# Patient Record
Sex: Female | Born: 1975
Health system: Southern US, Community
[De-identification: ages and names within clinical notes are randomized; demographics above are authoritative.]

## PROBLEM LIST (undated history)

## (undated) DIAGNOSIS — E669 Obesity, unspecified: Secondary | ICD-10-CM

## (undated) DIAGNOSIS — F329 Major depressive disorder, single episode, unspecified: Secondary | ICD-10-CM

## (undated) DIAGNOSIS — K219 Gastro-esophageal reflux disease without esophagitis: Secondary | ICD-10-CM

## (undated) DIAGNOSIS — T7840XA Allergy, unspecified, initial encounter: Secondary | ICD-10-CM

## (undated) DIAGNOSIS — I493 Ventricular premature depolarization: Secondary | ICD-10-CM

## (undated) DIAGNOSIS — G4730 Sleep apnea, unspecified: Secondary | ICD-10-CM

## (undated) DIAGNOSIS — F1910 Other psychoactive substance abuse, uncomplicated: Secondary | ICD-10-CM

## (undated) DIAGNOSIS — F17200 Nicotine dependence, unspecified, uncomplicated: Secondary | ICD-10-CM

## (undated) DIAGNOSIS — I10 Essential (primary) hypertension: Secondary | ICD-10-CM

## (undated) DIAGNOSIS — F32A Depression, unspecified: Secondary | ICD-10-CM

## (undated) DIAGNOSIS — F419 Anxiety disorder, unspecified: Secondary | ICD-10-CM

## (undated) HISTORY — DX: Essential (primary) hypertension: I10

## (undated) HISTORY — DX: Depression, unspecified: F32.A

## (undated) HISTORY — DX: Other psychoactive substance abuse, uncomplicated: F19.10

## (undated) HISTORY — DX: Sleep apnea, unspecified: G47.30

## (undated) HISTORY — DX: Nicotine dependence, unspecified, uncomplicated: F17.200

## (undated) HISTORY — PX: TUBAL LIGATION: SHX77

## (undated) HISTORY — DX: Major depressive disorder, single episode, unspecified: F32.9

## (undated) HISTORY — PX: WISDOM TOOTH EXTRACTION: SHX21

## (undated) HISTORY — DX: Ventricular premature depolarization: I49.3

## (undated) HISTORY — DX: Gastro-esophageal reflux disease without esophagitis: K21.9

## (undated) HISTORY — DX: Obesity, unspecified: E66.9

## (undated) HISTORY — DX: Allergy, unspecified, initial encounter: T78.40XA

## (undated) HISTORY — DX: Anxiety disorder, unspecified: F41.9

---

## 1995-04-29 HISTORY — PX: OTHER SURGICAL HISTORY: SHX169

## 1999-02-27 ENCOUNTER — Emergency Department (HOSPITAL_COMMUNITY): Admission: EM | Admit: 1999-02-27 | Discharge: 1999-02-27 | Payer: Self-pay | Admitting: Internal Medicine

## 2009-07-19 ENCOUNTER — Other Ambulatory Visit: Admission: RE | Admit: 2009-07-19 | Discharge: 2009-07-19 | Payer: Self-pay | Admitting: Obstetrics and Gynecology

## 2010-01-16 ENCOUNTER — Ambulatory Visit: Payer: Self-pay | Admitting: Cardiology

## 2010-01-18 ENCOUNTER — Ambulatory Visit: Payer: Self-pay | Admitting: Cardiology

## 2010-01-18 ENCOUNTER — Ambulatory Visit (HOSPITAL_COMMUNITY): Admission: RE | Admit: 2010-01-18 | Discharge: 2010-01-18 | Payer: Self-pay | Admitting: Cardiology

## 2010-02-01 ENCOUNTER — Ambulatory Visit: Payer: Self-pay | Admitting: Cardiology

## 2010-02-27 ENCOUNTER — Ambulatory Visit: Payer: Self-pay | Admitting: Cardiology

## 2010-05-23 DIAGNOSIS — E663 Overweight: Secondary | ICD-10-CM | POA: Insufficient documentation

## 2010-05-23 DIAGNOSIS — R002 Palpitations: Secondary | ICD-10-CM | POA: Insufficient documentation

## 2010-05-24 ENCOUNTER — Ambulatory Visit
Admission: RE | Admit: 2010-05-24 | Discharge: 2010-05-24 | Payer: Self-pay | Source: Home / Self Care | Attending: Internal Medicine | Admitting: Internal Medicine

## 2010-05-24 ENCOUNTER — Encounter: Payer: Self-pay | Admitting: Internal Medicine

## 2010-05-26 DIAGNOSIS — I4949 Other premature depolarization: Secondary | ICD-10-CM | POA: Insufficient documentation

## 2010-05-31 ENCOUNTER — Telehealth: Payer: Self-pay | Admitting: Internal Medicine

## 2010-05-31 ENCOUNTER — Other Ambulatory Visit: Payer: Self-pay | Admitting: Internal Medicine

## 2010-05-31 DIAGNOSIS — Q208 Other congenital malformations of cardiac chambers and connections: Secondary | ICD-10-CM

## 2010-06-05 NOTE — Assessment & Plan Note (Signed)
Summary: nep/tachycardia/bradycardia/mt   Visit Type:  Initial Consult Referring Provider:  Dr Deborah Chalk Primary Provider:  none  CC:  palpitations.  History of Present Illness: Ms Valerie Shannon is a pleasant 35 yo WF with a h/o obesity, tobacco use, and PVCs who presents today for EP consultation.  She states that she initially began to have palpitations 1 year ago.  She describes these palpitations as a "skipped heart beat" followed by a forceful heart beat.  These are more prominent when supine or when anxious.  She was placed on a monitor for a dental procedure 10/11 and was found to have very frequent PVCs.  She was referred to Dr Deborah Chalk who placed a 24 hour monitor which documented 22 thousand PVCs without NSVT.  She had an echocardiogram which revealed normal EF without LV or RV structural concerns.  Presently she finds that stress and elevated BP will cause PVCs.  She reports having "lots of stress" which she attributes to job, divorce, and being a single mom. She denies CP, SOB, dizziness, or syncope.  She reports hvaing occasional episodes of ligthheadedness particularly after a long surgical procedure at work.  She reports diaphoresis and lightheadedness.  She has been found to have blood sugars 60s.  She states that these episodes always resolve with eating something to elevate her blood sugar.  She denies any other episodes of presyncope or lightheadedness.  Current Medications (verified): 1)  None  Allergies (verified): No Known Drug Allergies  Past History:  Past Medical History: Obesity ongoing tobacco use PVCs  denies rheumatic fever or childhood medical issues  Past Surgical History: BTL 67  Family History: father had a sudden cardiac arrest at age 66 and was rescucitated 3-4 years ago.  He has an ICD and is followed by Dr Ladona Ridgel.  Paternal grandfather died suddenly at age 78 at home.  Social History: Works full time for [employer].  She is divorced and has 2  children. Tobacco Use - 1PPD (previously 2 PPD) Previously cocaine use but quit 2005 Alcohol Use - yes -- rare Regular Exercise - no  Review of Systems       All systems are reviewed and negative except as listed in the HPI.   Vital Signs:  Patient profile:   35 year old female Height:      65 inches Weight:      254 pounds BMI:     42.42 Pulse rate:   80 / minute BP sitting:   126 / 76  (left arm) Cuff size:   regular  Vitals Entered By: Hardin Negus, RMA (May 24, 2010 2:46 PM)  Physical Exam  General:  overweight Head:  normocephalic and atraumatic Eyes:  PERRLA/EOM intact; conjunctiva and lids normal. Mouth:  Teeth, gums and palate normal. Oral mucosa normal. Neck:  Neck supple, no JVD. No masses, thyromegaly or abnormal cervical nodes. Lungs:  Clear bilaterally to auscultation and percussion. Heart:  RRR, no m/r/g, no ectopy upon listening for several minutes Abdomen:  Bowel sounds positive; abdomen soft and non-tender without masses, organomegaly, or hernias noted. No hepatosplenomegaly. Msk:  Back normal, normal gait. Muscle strength and tone normal. Extremities:  No clubbing or cyanosis. Neurologic:  Alert and oriented x 3. Skin:  Intact without lesions or rashes. Psych:  Normal affect.   EKG  Procedure date:  05/24/2010  Findings:      sinus rhythm 80 bpm, PR 144, QRS 88, QTc 452, otherwise normale ekg  Holter Monitor  Procedure date:  02/27/2010  Findings:      predominant rhythm is sinus with PVCs.  During a 24 hour period, she had 22k PVCs (25% of beats) observed.  No episodes of VT.  NO other arrhythias.  HR rate 67-109 bpm (mean 92).  Comments:      Obtained by Dr Deborah Chalk [facility] office)  Echocardiogram  Procedure date:  01/18/2010  Findings:      LVEF 55-60%, LVEDD 50, IVS 13, PW 13, LA 47 normal LV wall motion, RV size and function were normal, no significant valvular abnormality  Comments:      Obtained by Dr  Deborah Chalk [facility])  Impression & Recommendations:  Problem # 1:  PREMATURE VENTRICULAR CONTRACTIONS, FREQUENT (ICD-427.69) Assessment Deteriorated The patient has a very high PVC burden as recently documented by her holter monitor 11/11.  Interestingly, she has no PVCs in the office today.  She is only moderately symptomatic with her PVCs.  Her primary concern is with the sudden death of her father and paternal grandfather.  Certainly, this family history is worrisome.  The patient's echo and EKG however apper to be low risk. Given her family history and frequent ventricular ectopy, I feel that further risk stratefication is necessary at this time. I have strongly recommended that she proceed with signal average EKG and also a cardiac MRI to rule out ARVD.  In addition, I would recommend a GXT to evaluate for catecholaminergic arrhythmias.  I have also advised that she take nadolol 10mg  daily. I will see her again in 4 weeks.   At this point, she is very reluctant to schedule further testing, stating that her work schedule will not allow.  I have informed her that I think that further testing is very important and have encouraged her to have this done soon. We will also attempt to obtain records from her father to further evaluate the cause of his sudden cardiac arrest.  Problem # 2:  OVERWEIGHT/OBESITY (ICD-278.02) weight loss is advised, however she shoudl avoid any weight loss supplements/ medical agents given her ventricular arrhythmias  Other Orders: EKG w/ Interpretation (93000) Treadmill (Treadmill) Cardiac MRI (Cardiac MRI)  Patient Instructions: 1)  Your physician has requested that you have an exercise tolerance test.  For further information please visit [email or web address]  Please also follow instruction sheet, as given. in 4-6 weeks with Dr Johney Frame 2)  Your physician has recommended you make the following change in your medication: start Nadolol 10mg   daily 3)  Your physician has requested that you have a cardiac MRI.  Cardiac MRI uses a computer to create images of your heart as it's beating, producing both still and moving pictures of your heart and major blood vessels. For further information please visit  [email or web address]  Please follow the instruction sheet given to you today for more information. 4)  Signal Avg EKG 5)  Pt will call back when she wants to schedule Prescriptions: NADOLOL 20 MG TABS (NADOLOL) 1/2 tablet daily  #30 x 3   Entered by:   Dennis Bast, RN, BSN   Authorized by:   Hillis Range, MD   Signed by:   Dennis Bast, RN, BSN on 05/24/2010   Method used:   Electron[facility] to        [facility]  [address] [phone][facility] (retail)       [address]       [place], [place]  [id number]  Ph: [phone] or [phone]       Fax: [phone]   RxID:   [id number]

## 2010-06-05 NOTE — Progress Notes (Signed)
Summary: cardiac mri on hold at this time per pt request  Phone Note Outgoing Call   Caller: gesila davis  Call placed by: Lorne Skeens,  May 31, 2010 2:53 PM Call placed to: Patient Request: Send information Summary of Call: Call patient to inform her of an  appt for cardiac mri. pt inform me that she wanted to put the cardiac mri on hold at this time,she will call back when she decided if she gonna have it or not.  Initial call taken by: Lorne Skeens,  May 31, 2010 2:56 PM

## 2010-06-12 ENCOUNTER — Other Ambulatory Visit (HOSPITAL_COMMUNITY): Payer: Self-pay

## 2010-07-11 ENCOUNTER — Encounter: Payer: Self-pay | Admitting: Internal Medicine

## 2010-10-24 ENCOUNTER — Emergency Department (HOSPITAL_COMMUNITY): Payer: 59

## 2010-10-24 ENCOUNTER — Emergency Department (HOSPITAL_COMMUNITY)
Admission: EM | Admit: 2010-10-24 | Discharge: 2010-10-24 | Disposition: A | Discharge status: Home / Self Care | Payer: 59 | Attending: Emergency Medicine | Admitting: Emergency Medicine

## 2010-10-24 ENCOUNTER — Telehealth: Payer: Self-pay | Admitting: Internal Medicine

## 2010-10-24 DIAGNOSIS — R002 Palpitations: Secondary | ICD-10-CM | POA: Insufficient documentation

## 2010-10-24 DIAGNOSIS — R52 Pain, unspecified: Secondary | ICD-10-CM | POA: Insufficient documentation

## 2010-10-24 DIAGNOSIS — R0609 Other forms of dyspnea: Secondary | ICD-10-CM | POA: Insufficient documentation

## 2010-10-24 DIAGNOSIS — R0989 Other specified symptoms and signs involving the circulatory and respiratory systems: Secondary | ICD-10-CM | POA: Insufficient documentation

## 2010-10-24 DIAGNOSIS — D649 Anemia, unspecified: Secondary | ICD-10-CM | POA: Insufficient documentation

## 2010-10-24 DIAGNOSIS — R072 Precordial pain: Secondary | ICD-10-CM | POA: Insufficient documentation

## 2010-10-24 DIAGNOSIS — R0602 Shortness of breath: Secondary | ICD-10-CM | POA: Insufficient documentation

## 2010-10-24 DIAGNOSIS — Z79899 Other long term (current) drug therapy: Secondary | ICD-10-CM | POA: Insufficient documentation

## 2010-10-24 LAB — BASIC METABOLIC PANEL
CO2: 23 mEq/L (ref 19–32)
Calcium: 7.6 mg/dL — ABNORMAL LOW (ref 8.4–10.5)
Chloride: 104 mEq/L (ref 96–112)
Potassium: 4 mEq/L (ref 3.5–5.1)
Sodium: 138 mEq/L (ref 135–145)

## 2010-10-24 LAB — CBC
MCH: 21.7 pg — ABNORMAL LOW (ref 26.0–34.0)
MCHC: 29.1 g/dL — ABNORMAL LOW (ref 30.0–36.0)
Platelets: 216 10*3/uL (ref 150–400)
RDW: 19.5 % — ABNORMAL HIGH (ref 11.5–15.5)

## 2010-10-24 LAB — CK TOTAL AND CKMB (NOT AT ARMC): Total CK: 69 U/L (ref 7–177)

## 2010-10-24 NOTE — Telephone Encounter (Signed)
Pt. States she  was seen by Dr. Johney Frame on January 27/12. Pt. Was prescribed  Nadolol 20 mg tablets 1/2 tablet daily, and stop the Metoprolol 50 mg.  Patient said never got the medication, and she continue taken Metoprolol 50 mg daily. Pt stop taken the medication for 2 nights Monday and Tuesday. Then she started having chest pain and PVC's. She was taken to [facility] ER today. There labs and other tests were normal and send home. She was advised to call the office today Pt. Would like to know if she needs to start taken the Nadolol 10 mg daily or need to have the dose increased. Does  she needs to be seen in the office soon. Nadolol 10 mg [facility] to start taken. Pt. Aware to start medication. I will let the MD and nurse know about this issue.

## 2010-10-24 NOTE — Telephone Encounter (Signed)
Pt went to emergency room - by EMS. Pt at home now. Dr. Deborah Chalk put pt on beta blocker. Dr. Johney Frame changed the med's. Pt never gotten the medication filled.  Pt wants to discuss medication called in or come into the office for an appoitment.  S/p pvc.

## 2010-10-31 NOTE — Telephone Encounter (Signed)
Please schedule follow-up with me as a next available (not urgent).

## 2010-11-04 NOTE — Telephone Encounter (Signed)
APPT SCHEDULED FOR 12-11-10  AT 3:30 PM WITH DR Johney Frame . PT AWARE /CY

## 2010-12-11 ENCOUNTER — Encounter: Payer: Self-pay | Admitting: Internal Medicine

## 2010-12-11 ENCOUNTER — Ambulatory Visit (INDEPENDENT_AMBULATORY_CARE_PROVIDER_SITE_OTHER): Payer: 59 | Admitting: Internal Medicine

## 2010-12-11 DIAGNOSIS — F17200 Nicotine dependence, unspecified, uncomplicated: Secondary | ICD-10-CM

## 2010-12-11 DIAGNOSIS — E663 Overweight: Secondary | ICD-10-CM

## 2010-12-11 DIAGNOSIS — R079 Chest pain, unspecified: Secondary | ICD-10-CM | POA: Insufficient documentation

## 2010-12-11 DIAGNOSIS — Z720 Tobacco use: Secondary | ICD-10-CM

## 2010-12-11 DIAGNOSIS — I4949 Other premature depolarization: Secondary | ICD-10-CM

## 2010-12-11 MED ORDER — VARENICLINE TARTRATE 0.5 MG X 11 & 1 MG X 42 PO MISC: ORAL | Status: AC

## 2010-12-11 NOTE — Patient Instructions (Addendum)
Your physician wants you to follow-up in: 6 months with Dr Jacquiline Doe will receive a reminder letter in the mail two months in advance. If you don't receive a letter, please call our office to schedule the follow-up appointment.   Start Chantix starter pack

## 2010-12-11 NOTE — Assessment & Plan Note (Signed)
Given the temporal correlation to Ibuprofen, her pain may be GI in origin.  Given her very concerning family history of sudden death/ arrhythmias, I think that we should also exclude ischemia as a cause.  I have strongly advised GXT at this time.  Unfortunately, she is clear in her decision to decline.  She will contact me immediately should any further chest discomfort occur.  If she develops sustained chest discomfort, she should return to [facility] for more urgent evaluation.

## 2010-12-11 NOTE — Assessment & Plan Note (Signed)
Presently symptoms are very well controlled with nadolol.  She is tolerating this medicine without difficulty.  I remain very concerned about her family history of sudden death.  I have again recommended that we proceed with cardiac MRI and SAEKG to evaluate for ARVD as well as a GXT to exclude both ischemia and catecholaminergic VT.  Unfortunately, she is very clear in her decision to decline further cardiac testing at this time.  She will continue nadolol.  She is willing to contact me immediately should she have syncope, presyncope, or worsening palpitations.

## 2010-12-11 NOTE — Progress Notes (Signed)
The patient presents today for routine electrophysiology followup.  Since last being seen in our clinic, the patient reports doing reasonably well.   She feels that her PVCs are now well controlled with nadolol but has only taken nadolol for several weeks.  She did not comply with cardiac MRI, SAEKG or GXT as I previously recommended. She reports doing very well until 10/24/10.  She states that she was at work when she developed acute chest "pressure" lasting 15 minutes.  She reports gradually increases in heart rate during the episode and also noticed PVCs.  She reports becoming "very anxious".  She reports feeling PVCs after becoming anxious.  She was seen at [facility] and found to have only sinus rhythm.  I have reviewed the EKG in [facility] which reveals no ischemic changes.  She was discharge.  In retrospect, she states that she had taken ibuprofen 800mg  earlier that morning. She did well until 1 week ago, when she developed similar "chest pressure" lasting about 10 minutes.  She denies associated palpitations, SOB, dizziness, presyncope or syncope.  This episode spontaneously resolved and she did not seek medical attention.  She reports taking Ibuprofen that morning also and feels that her symptoms were likely due to taking this medicine.  She has done very well since that time.    Today, she denies symptoms of palpitations, chest pain, shortness of breath, orthopnea, PND, lower extremity edema, dizziness, presyncope, syncope, or neurologic sequela.  The patient feels that she is tolerating medications without difficulties and is otherwise without complaint today.   Past Medical History  Diagnosis Date  . Premature ventricular contractions   . Obesity   . Tobacco dependence    Past Surgical History  Procedure Date  . Bilateral tubal ligation 1997    Current Outpatient Prescriptions  Medication Sig Dispense Refill  . acetaminophen (TYLENOL) 325 MG tablet Take 650 mg by mouth every 6  (six) hours as needed.        . Cyanocobalamin (VITAMIN B-12 CR PO) Take 1 tablet by mouth daily.        . Multiple Vitamin (MULTIVITAMIN) capsule Take 1 capsule by mouth daily.        . nadolol (CORGARD) 20 MG tablet 1/2 TAB PO QD       . Omega-3 Fatty Acids (FISH OIL BURP-LESS PO) Take 1 tablet by mouth daily.        . varenicline (CHANTIX STARTING MONTH PAK) 0.5 MG X 11 & 1 MG X 42 tablet Take one 0.5mg  tablet by mouth once daily for 3 days, then increase to one 0.5mg  tablet twice daily for 3 days, then increase to one 1mg  tablet twice daily.  53 tablet  0    No Known Allergies  History   Social History  . Marital Status: Divorced    Spouse Name: N/A    Number of Children: N/A  . Years of Education: N/A   Occupational History  . Not on file.   Social History Main Topics  . Smoking status: Current Some Day Smoker  . Smokeless tobacco: Not on file   Comment: chantix prescribed today, she is willing to quit  . Alcohol Use: Yes     rare  . Drug Use: No  . Sexually Active: Not on file   Other Topics Concern  . Not on file   Social History Narrative   Works full time for [employer].  She is divorced and has 2 children.  Family History  Problem Relation Age of Onset  . Arrhythmia      father had a sudden cardiac arrest at age 59 and was rescucitated 3-4 years ago.  He has an ICD and is followed by Dr Ladona Ridgel.  Paternal grandfather died suddenly at age 47 at home.    ROS-  All systems are reviewed and are negative except as outlined in the HPI above    Physical Exam: Filed Vitals:   12/11/10 1541  BP: 138/79  Pulse: 92  Resp: 12  Height: 5\' 5"  (1.651 m)  Weight: 262 lb (118.842 kg)    GEN- The patient is well appearing, alert and oriented x 3 today.   Head- normocephalic, atraumatic Eyes-  Sclera clear, conjunctiva pink Ears- hearing intact Oropharynx- clear Neck- supple, no JVP Lymph- no cervical lymphadenopathy Lungs- Clear to ausculation  bilaterally, normal work of breathing Heart- Regular rate and rhythm, no murmurs, rubs or gallops, PMI not laterally displaced GI- soft, NT, ND, + BS Extremities- no clubbing, cyanosis, or edema MS- no significant deformity or atrophy Skin- no rash or lesion Psych- euthymic mood, full affect Neuro- strength and sensation are intact  10/24/10 ekg from [facility] reveals sinus rhythm, no ischemic changes, Qtc 446  Assessment and Plan:

## 2010-12-11 NOTE — Assessment & Plan Note (Signed)
Cessation is strongly advised She is ready to quit. She states that she has well tolerated chantix in the past.  She has no suicidal ideations.  I will therefore initiate a chantix starter pack today.

## 2010-12-11 NOTE — Assessment & Plan Note (Signed)
Weight loss encouraged 

## 2012-03-16 ENCOUNTER — Encounter: Payer: Self-pay | Admitting: Cardiology

## 2012-03-28 ENCOUNTER — Ambulatory Visit (INDEPENDENT_AMBULATORY_CARE_PROVIDER_SITE_OTHER): Payer: 59 | Admitting: Family Medicine

## 2012-03-28 VITALS — BP 137/82 | HR 100 | Temp 98.2°F | Resp 18 | Ht 65.0 in | Wt 256.6 lb

## 2012-03-28 DIAGNOSIS — F411 Generalized anxiety disorder: Secondary | ICD-10-CM

## 2012-03-28 DIAGNOSIS — R5381 Other malaise: Secondary | ICD-10-CM

## 2012-03-28 DIAGNOSIS — F32A Depression, unspecified: Secondary | ICD-10-CM

## 2012-03-28 DIAGNOSIS — R5383 Other fatigue: Secondary | ICD-10-CM

## 2012-03-28 DIAGNOSIS — F329 Major depressive disorder, single episode, unspecified: Secondary | ICD-10-CM

## 2012-03-28 LAB — POCT CBC
Granulocyte percent: 79.1 %G (ref 37–80)
HCT, POC: 43.3 % (ref 37.7–47.9)
Hemoglobin: 13.1 g/dL (ref 12.2–16.2)
Lymph, poc: 1.9 (ref 0.6–3.4)
MCH, POC: 25.5 pg — AB (ref 27–31.2)
MCHC: 30.3 g/dL — AB (ref 31.8–35.4)
MCV: 84.4 fL (ref 80–97)
MID (cbc): 0.7 (ref 0–0.9)
MPV: 8.9 fL (ref 0–99.8)
POC Granulocyte: 10 — AB (ref 2–6.9)
POC LYMPH PERCENT: 15.2 %L (ref 10–50)
POC MID %: 5.7 %M (ref 0–12)
Platelet Count, POC: 237 10*3/uL (ref 142–424)
RBC: 5.13 M/uL (ref 4.04–5.48)
RDW, POC: 16 %
WBC: 12.7 10*3/uL — AB (ref 4.6–10.2)

## 2012-03-28 LAB — COMPREHENSIVE METABOLIC PANEL
ALT: 19 U/L (ref 0–35)
AST: 17 U/L (ref 0–37)
Albumin: 4.2 g/dL (ref 3.5–5.2)
Alkaline Phosphatase: 83 U/L (ref 39–117)
BUN: 11 mg/dL (ref 6–23)
CO2: 30 mEq/L (ref 19–32)
Calcium: 9.2 mg/dL (ref 8.4–10.5)
Chloride: 101 mEq/L (ref 96–112)
Creat: 0.64 mg/dL (ref 0.50–1.10)
Glucose, Bld: 88 mg/dL (ref 70–99)
Potassium: 4.4 mEq/L (ref 3.5–5.3)
Sodium: 139 mEq/L (ref 135–145)
Total Bilirubin: 0.4 mg/dL (ref 0.3–1.2)
Total Protein: 7 g/dL (ref 6.0–8.3)

## 2012-03-28 LAB — LIPID PANEL
Cholesterol: 158 mg/dL (ref 0–200)
HDL: 37 mg/dL — ABNORMAL LOW (ref >39)
LDL Cholesterol: 60 mg/dL (ref 0–99)
Total CHOL/HDL Ratio: 4.3 Ratio
Triglycerides: 306 mg/dL — ABNORMAL HIGH (ref <150)
VLDL: 61 mg/dL — ABNORMAL HIGH (ref 0–40)

## 2012-03-28 LAB — POCT GLYCOSYLATED HEMOGLOBIN (HGB A1C): Hemoglobin A1C: 5.2

## 2012-03-28 LAB — TSH: TSH: 1.123 u[IU]/mL (ref 0.350–4.500)

## 2012-03-28 LAB — GLUCOSE, POCT (MANUAL RESULT ENTRY): POC Glucose: 79 mg/dl (ref 70–99)

## 2012-03-28 MED ORDER — DULOXETINE HCL 20 MG PO CPEP: 20.0000 mg | ORAL_CAPSULE | Freq: Every day | ORAL | Status: DC

## 2012-03-28 NOTE — Patient Instructions (Addendum)

## 2012-03-28 NOTE — Progress Notes (Signed)
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  She has a remote (10 yrs ago) h/o cocaine addiction.  She is single parent for 2 boys aged 40 and 26, the older of which is very independent and assertive.  She has taken zoloft, prozac and lexapro which gave her side effects.  She took Wellbutrin which worked well for 2 years, then stopped working.  Patient is fatigued all the time.  She denies suicidal thoughts, but has dark thoughts of dying much of the time  O:  Reviewed above hx  A:  Depressed without suicidal ideation 1. Depression  DULoxetine (CYMBALTA) 20 MG capsule  2. Fatigue  POCT CBC, POCT glucose (manual entry), POCT glycosylated hemoglobin (Hb A1C), Lipid panel, Comprehensive metabolic panel, TSH

## 2012-03-29 ENCOUNTER — Telehealth: Payer: Self-pay | Admitting: Radiology

## 2012-03-29 NOTE — Telephone Encounter (Signed)
Please advise on labs,you have commented, but now the comment is gone. Valerie Shannon

## 2012-04-01 NOTE — Telephone Encounter (Signed)
LMOM to CB. 

## 2012-04-01 NOTE — Telephone Encounter (Signed)
Pt CB and I explained Dr Cain Saupe message. Pt agreed and stated that she was not fasting when lab was done either so that could have affected Triglycerides.

## 2012-04-01 NOTE — Telephone Encounter (Signed)
Labs are okay.  The triglycerides are elevated, but cholesterol is fine.  The triglycerides are a very weak risk factor for heart disease.  They decrease with green vegetables, exercise, and weight loss

## 2012-04-04 ENCOUNTER — Telehealth: Payer: Self-pay

## 2012-04-04 NOTE — Telephone Encounter (Signed)
Spoke with pt, her insurance needs a prior authorization for her cymbalta. Can we check on this?

## 2012-04-04 NOTE — Telephone Encounter (Signed)
Patient was in last Sunday and would like to speak with a nurse regarding an issue with her medication.   [phone]

## 2012-04-05 NOTE — Telephone Encounter (Signed)
LMOM for pt to CB. I called ins co and they stated that the Rx has to go through mail order in order to be covered by ins. Ask pt if she wants [place] to send it to [facility] for a 90 day supply?

## 2012-04-05 NOTE — Telephone Encounter (Signed)
Spoke with pt and she states this is ok but wants to wait for the 90 day mail in because she feels she needs to go up on her dose. She will call us back in 2 weeks to give [place] an update on whether to increase or not.

## 2012-04-13 ENCOUNTER — Telehealth: Payer: Self-pay

## 2012-04-13 DIAGNOSIS — F329 Major depressive disorder, single episode, unspecified: Secondary | ICD-10-CM

## 2012-04-13 DIAGNOSIS — F32A Depression, unspecified: Secondary | ICD-10-CM

## 2012-04-13 NOTE — Telephone Encounter (Signed)
Pt has been on antidepressant for 2 weeks and feels like it needs to be increased Will need to go through [employer] and per pt we have info on file, she has spoken to [employer] in the past in regards to this issue

## 2012-04-14 NOTE — Telephone Encounter (Signed)
Cymbalta 20 mg currently patient wants to increase. Please advise.

## 2012-04-14 NOTE — Telephone Encounter (Signed)
Pt states that since the increase has been approved and she will need and rx sooner -patient states that her insurance will need pre authorization and she would like to have this started so she wont run out

## 2012-04-14 NOTE — Telephone Encounter (Signed)
She can increase to 2 tablets daily x 2 weeks, and then if she still feels like it should be increased, can start 3 tablets daily.  Will run out of rx sooner than expected so please keep [place] informed via phone call or [facility] for refills and updates.  If 40 mg daily sufficient, stay at that dose but can safely go up to 60 mg

## 2012-04-15 MED ORDER — DULOXETINE HCL 20 MG PO CPEP: 40.0000 mg | ORAL_CAPSULE | Freq: Every day | ORAL | Status: DC

## 2012-04-15 NOTE — Telephone Encounter (Signed)
Sent in for her, if prior auth needed, pharmacy will let me know.

## 2012-06-17 ENCOUNTER — Telehealth: Payer: Self-pay

## 2012-06-17 DIAGNOSIS — F329 Major depressive disorder, single episode, unspecified: Secondary | ICD-10-CM

## 2012-06-17 DIAGNOSIS — F32A Depression, unspecified: Secondary | ICD-10-CM

## 2012-06-17 MED ORDER — DULOXETINE HCL 20 MG PO CPEP: 40.0000 mg | ORAL_CAPSULE | Freq: Every day | ORAL | Status: DC

## 2012-06-17 NOTE — Telephone Encounter (Signed)
LMOM to CB, no mail order on file from what I see. CB to verify pharmacy.

## 2012-06-17 NOTE — Telephone Encounter (Signed)
PT STATES SHE NOW HAVE TO DO MAIL ORDER FOR HER MEDS AND IS IN NEED OF HER CYMB ALTA. SHE DIDN'T KNOW WHAT MAIL ORDER IT WAS BUT STATED WE HAVE IT IN OUR RECORDS PLEASE CALL [phone]

## 2012-06-17 NOTE — Telephone Encounter (Signed)
Pt called back she is using [facility] for her mail order. Can we send in her Cymbalta?

## 2012-06-17 NOTE — Telephone Encounter (Signed)
Sent in Rx for 6 months -

## 2012-12-22 ENCOUNTER — Telehealth: Payer: Self-pay

## 2012-12-22 DIAGNOSIS — F329 Major depressive disorder, single episode, unspecified: Secondary | ICD-10-CM

## 2012-12-22 DIAGNOSIS — F32A Depression, unspecified: Secondary | ICD-10-CM

## 2012-12-22 NOTE — Telephone Encounter (Signed)
Pt requesting mail order refill for her generic Cymbalta([facility])   Best phone for pt is [phone]

## 2012-12-23 MED ORDER — DULOXETINE HCL 20 MG PO CPEP: 40.0000 mg | ORAL_CAPSULE | Freq: Every day | ORAL | Status: DC

## 2012-12-23 NOTE — Telephone Encounter (Signed)
Pt advised.

## 2012-12-23 NOTE — Telephone Encounter (Signed)
Pended please advise. Unclear when Dr Milus Glazier wanted her to follow up.

## 2012-12-23 NOTE — Telephone Encounter (Signed)
Sent to pharmacy.  Follow up before this rx runs out

## 2013-02-10 ENCOUNTER — Telehealth: Payer: Self-pay

## 2013-02-10 NOTE — Telephone Encounter (Signed)
We can not treat without office visit, if she would like to come in, we can see her. Called her to advise

## 2013-02-10 NOTE — Telephone Encounter (Signed)
Pt is calling because she had called her OBGYN about her server menstrual bleeding and they could not give her anything to slow down the bleeding if somone could please give her a call back Call back number [phone]

## 2013-03-06 ENCOUNTER — Telehealth: Payer: Self-pay

## 2013-03-06 DIAGNOSIS — F329 Major depressive disorder, single episode, unspecified: Secondary | ICD-10-CM

## 2013-03-06 DIAGNOSIS — F32A Depression, unspecified: Secondary | ICD-10-CM

## 2013-03-06 NOTE — Telephone Encounter (Signed)
Pt states she was instructed by our office, ok to increase symbalta to 60mg . As a result she will run out in 2 weeks and will need refill through her Mail order company, [facility] for refills. Please call pt to advise

## 2013-03-07 ENCOUNTER — Emergency Department (HOSPITAL_COMMUNITY): Payer: 59

## 2013-03-07 ENCOUNTER — Emergency Department (HOSPITAL_COMMUNITY)
Admission: EM | Admit: 2013-03-07 | Discharge: 2013-03-07 | Disposition: A | Discharge status: Home / Self Care | Payer: 59 | Attending: Emergency Medicine | Admitting: Emergency Medicine

## 2013-03-07 ENCOUNTER — Encounter (HOSPITAL_COMMUNITY): Payer: Self-pay | Admitting: Emergency Medicine

## 2013-03-07 DIAGNOSIS — R079 Chest pain, unspecified: Secondary | ICD-10-CM

## 2013-03-07 DIAGNOSIS — R110 Nausea: Secondary | ICD-10-CM | POA: Insufficient documentation

## 2013-03-07 DIAGNOSIS — Z91040 Latex allergy status: Secondary | ICD-10-CM | POA: Insufficient documentation

## 2013-03-07 DIAGNOSIS — R6884 Jaw pain: Secondary | ICD-10-CM | POA: Insufficient documentation

## 2013-03-07 DIAGNOSIS — I4949 Other premature depolarization: Secondary | ICD-10-CM

## 2013-03-07 DIAGNOSIS — Z8659 Personal history of other mental and behavioral disorders: Secondary | ICD-10-CM | POA: Insufficient documentation

## 2013-03-07 DIAGNOSIS — E663 Overweight: Secondary | ICD-10-CM

## 2013-03-07 DIAGNOSIS — F17200 Nicotine dependence, unspecified, uncomplicated: Secondary | ICD-10-CM | POA: Insufficient documentation

## 2013-03-07 DIAGNOSIS — Z720 Tobacco use: Secondary | ICD-10-CM

## 2013-03-07 DIAGNOSIS — N920 Excessive and frequent menstruation with regular cycle: Secondary | ICD-10-CM | POA: Insufficient documentation

## 2013-03-07 DIAGNOSIS — Z79899 Other long term (current) drug therapy: Secondary | ICD-10-CM | POA: Insufficient documentation

## 2013-03-07 DIAGNOSIS — E669 Obesity, unspecified: Secondary | ICD-10-CM | POA: Insufficient documentation

## 2013-03-07 DIAGNOSIS — Z8679 Personal history of other diseases of the circulatory system: Secondary | ICD-10-CM | POA: Insufficient documentation

## 2013-03-07 LAB — CBC
HCT: 27.8 % — ABNORMAL LOW (ref 36.0–46.0)
MCH: 22 pg — ABNORMAL LOW (ref 26.0–34.0)
MCHC: 29.1 g/dL — ABNORMAL LOW (ref 30.0–36.0)
MCV: 75.3 fL — ABNORMAL LOW (ref 78.0–100.0)
RDW: 17.9 % — ABNORMAL HIGH (ref 11.5–15.5)

## 2013-03-07 LAB — BASIC METABOLIC PANEL
BUN: 10 mg/dL (ref 6–23)
CO2: 23 mEq/L (ref 19–32)
Chloride: 101 mEq/L (ref 96–112)
Creatinine, Ser: 0.74 mg/dL (ref 0.50–1.10)

## 2013-03-07 MED ORDER — DULOXETINE HCL 20 MG PO CPEP: 40.0000 mg | ORAL_CAPSULE | Freq: Every day | ORAL | Status: DC

## 2013-03-07 MED ORDER — NITROGLYCERIN 0.4 MG SL SUBL: 0.4000 mg | SUBLINGUAL_TABLET | SUBLINGUAL | Status: DC | PRN

## 2013-03-07 NOTE — Telephone Encounter (Signed)
Rx sent to mail order.  Meds ordered this encounter  Medications  . DULoxetine (CYMBALTA) 20 MG capsule    Sig: Take 2 capsules (40 mg total) by mouth daily.    Dispense:  180 capsule    Refill:  0    Order Specific Question:  Supervising Provider    Answer:  DOOLITTLE, ROBERT P [[id number]]

## 2013-03-07 NOTE — ED Provider Notes (Addendum)
CSN: [id number]     Arrival date & time 03/07/13  1115 History   First MD Initiated Contact with Patient 03/07/13 1124     Chief Complaint  Patient presents with  . Chest Pain   (Consider location/radiation/quality/duration/timing/severity/associated sxs/prior Treatment) HPI Patient developed bilateral jaw pain daily followed by anterior chest pain sharp and pressure-like "like someone sitting on my chest" onset 9:30 AM today symptoms lasted approximately 30 minutes . Patient got complete relief after she was to with one sublingual nitroglycerin and aspirin by EMS. Accompanying symptoms include nausea and sweatiness. She is presently asymptomatic. Cardiac risk factors strong family history otherwise negative Past Medical History  Diagnosis Date  . Premature ventricular contractions   . Obesity   . Tobacco dependence   . Depression   . Substance abuse   . Anxiety    Past Surgical History  Procedure Laterality Date  . Bilateral tubal ligation  1997  . Tubal ligation     Family History  Problem Relation Age of Onset  . Arrhythmia      father had a sudden cardiac arrest at age 45 and was rescucitated 3-4 years ago.  He has an ICD and is followed by Dr Ladona Ridgel.  Paternal grandfather died suddenly at age 25 at home.  . Depression Mother   . Heart disease Father   . ADD / ADHD Son   . Depression Maternal Grandmother   . Cancer Maternal Grandmother   . Depression Maternal Grandfather   . Cancer Paternal Grandmother   . Heart disease Paternal Grandmother   . Heart disease Paternal Grandfather    History  Substance Use Topics  . Smoking status: Current Some Day Smoker  . Smokeless tobacco: Not on file     Comment: chantix prescribed today, she is willing to quit  . Alcohol Use: Yes     Comment: rare   OB History   Grav Para Term Preterm Abortions TAB SAB Ect Mult Living                 Review of Systems  Constitutional: Negative.   HENT: Negative.   Respiratory: Negative.    Cardiovascular: Positive for chest pain.  Gastrointestinal: Negative.   Genitourinary:       Heavy menses  Musculoskeletal: Negative.   Skin: Negative.   Neurological: Negative.   Psychiatric/Behavioral: Negative.   All other systems reviewed and are negative.    Allergies  Latex  Home Medications   Current Outpatient Rx  Name  Route  Sig  Dispense  Refill  . Cyanocobalamin (VITAMIN B-12 CR PO)   Oral   Take 1 tablet by mouth daily.          . DULoxetine (CYMBALTA) 60 MG capsule   Oral   Take 60 mg by mouth daily.         Marland Kitchen levonorgestrel (MIRENA) 20 MCG/24HR IUD   Intrauterine   1 each by Intrauterine route once.         . Multiple Vitamin (MULTIVITAMIN) capsule   Oral   Take 1 capsule by mouth daily.           Marland Kitchen omeprazole (PRILOSEC) 20 MG capsule   Oral   Take 20 mg by mouth daily.          BP 127/66  Pulse 89  Temp(Src) 98.6 F (37 C) (Oral)  Resp 22  Wt 250 lb (113.399 kg)  SpO2 100% Physical Exam  Nursing note and vitals reviewed. Constitutional: She  appears well-developed and well-nourished.  HENT:  Head: Normocephalic and atraumatic.  Eyes: Conjunctivae are normal. Pupils are equal, round, and reactive to light.  Neck: Neck supple. No tracheal deviation present. No thyromegaly present.  Cardiovascular: Normal rate and regular rhythm.   No murmur heard. Pulmonary/Chest: Effort normal and breath sounds normal.  Abdominal: Soft. Bowel sounds are normal. She exhibits no distension. There is no tenderness.  Obese  Musculoskeletal: Normal range of motion. She exhibits no edema and no tenderness.  Neurological: She is alert. Coordination normal.  Skin: Skin is warm and dry. No rash noted.  Psychiatric: She has a normal mood and affect.    ED Course  Procedures (including critical care time) Labs Review Labs Reviewed  BASIC METABOLIC PANEL  CBC   Imaging Review No results found.  EKG Interpretation   None       Date:  03/07/2013  Rate: 90  Rhythm: normal sinus rhythm and premature ventricular contractions (PVC)  QRS Axis: normal  Intervals: normal  ST/T Wave abnormalities: normal  Conduction Disutrbances:none  Narrative Interpretation:   Old EKG Reviewed: Tracing from 10/24/2010 did not show PVCs Chest xray viewed  By me Results for orders placed during the hospital encounter of 03/07/13  BASIC METABOLIC PANEL      Result Value Range   Sodium 137  135 - 145 mEq/L   Potassium 4.1  3.5 - 5.1 mEq/L   Chloride 101  96 - 112 mEq/L   CO2 23  19 - 32 mEq/L   Glucose, Bld 93  70 - 99 mg/dL   BUN 10  6 - 23 mg/dL   Creatinine, Ser 1.61  0.50 - 1.10 mg/dL   Calcium 9.3  8.4 - 09.6 mg/dL   GFR calc non Af Amer >90  >90 mL/min   GFR calc Af Amer >90  >90 mL/min  CBC      Result Value Range   WBC 12.0 (*) 4.0 - 10.5 K/uL   RBC 3.69 (*) 3.87 - 5.11 MIL/uL   Hemoglobin 8.1 (*) 12.0 - 15.0 g/dL   HCT 04.5 (*) 40.9 - 81.1 %   MCV 75.3 (*) 78.0 - 100.0 fL   MCH 22.0 (*) 26.0 - 34.0 pg   MCHC 29.1 (*) 30.0 - 36.0 g/dL   RDW 91.4 (*) 78.2 - 95.6 %   Platelets 215  150 - 400 K/uL  POCT I-STAT TROPONIN I      Result Value Range   Troponin i, poc 0.00  0.00 - 0.08 ng/mL   Comment 3            Dg Chest 2 View  03/07/2013   CLINICAL DATA:  Chest pain, shortness of breath  EXAM: CHEST  2 VIEW  COMPARISON:  10/24/2010  FINDINGS: The heart size and mediastinal contours are within normal limits. Both lungs are clear. The visualized skeletal structures are unremarkable.  IMPRESSION: No active cardiopulmonary disease.   Electronically Signed   By: Ruel Favors M.D.   On: 03/07/2013 11:58   Chest xray viewed by me Dr.Jordan from cardiology service and I wrote patient in ED and made arrangements for outpatient followup MDM  No diagnosis found. [facility]ology asked to evalaute pt in the ED Dx #1 chest pain #2 anemia    Doug Sou, MD 03/07/13 1431  Doug Sou, MD 03/07/13 1737

## 2013-03-07 NOTE — ED Notes (Signed)
Pt brought to ED by EMS with chest pain.Pt complained of sharp pain in the central chest radiating to the rt jaw and back with SOB.As per pt pain subsided with nitroX1 and aspirin 325mg  given by EMS.

## 2013-03-07 NOTE — ED Notes (Signed)
Pt discharged.Vital signs stable and GCS 15.Discharge instruction given. 

## 2013-03-07 NOTE — Consult Note (Signed)
History and Physical  Patient ID: Keeli Roberg MRN: [id number], SOB: June 01, 1975 37 y.o. Date of Encounter: 03/07/2013, 1:08 PM  Primary Physician: No primary provider on file. Primary Cardiologist: Hillis Range, MD  Chief Complaint: midsternal chest pain radiating into her jaw  HPI: 37 y.o. female w/ PMHx significant for GERD, TMJ, obesity, tobacco use, and PVCs who presented to [facility] on 03/07/2013 with complaints of chest pain radiating into her right jaw.  She states that she was at work this morning and around 9:30am she began to develop bilateral jaw pain at the angle of the jaw and thought it was her TMJ, but then she began having mid sternal chest pain/pressure that began later with associated SOB and mild diaphoresis. She denies any nausea/vomiting, abdominal pain. She rates her pain as an 8/10 and states that it felt like "someone was sitting on her chest". She was given ASA 325 and a SL Nitroglycerin tablet and states that that pain resolved later. She states that this was similar to the pain she was having in 2012. She denies any other similar episodes since she was seen by Dr. Johney Frame until today. She does endorse significant stressors at home and at work, but denies any recent increases. She does not exercise, and endorses eating a salt heavy diet over the weekend and has been retaining fluid, which is improved today. She states that normally she eats a pretty health diet, grilling her food and avoiding high fat foods. She states that she quit smoking 04/2012, and denies drug use she states that she did drink 1 glass of wine on Sat and that was the 1st EtOH she had in 20mo.   She has a h/o palpitations with PVCs noted during a dental procedure in 02/2010. She was referred to Dr Deborah Chalk who placed a 24 hour monitor which documented 22 thousand PVCs without NSVT. She did have an echo in 12/2009 which showed normal EF w/o LV or RV structural concerns.   She was  seen by Dr. Johney Frame in 04/2010 for her heart palpitations, and had no PVCs at that time. At that time, the pt endorsed that with elevated blood pressure and stress, she she was having the PVCs. A cardiac MRI was recommended by Dr. Johney Frame at the time, but the patient requested to wait, and it was never done. An exercise tolerance test was also ordered by Dr. Johney Frame but was not done, and the pt was started on Nadolol 10mg  daily. She was seen again in 11/2010 by Dr. 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 Per pt, she was also having PVCs. She was seen in the ED and was found to be in sinus rhythm. However, she developed another episode of "chest pressure" just prior to seeing Dr. Johney Frame that lasted for . The episode resolved spontaneously, and she denied any associated palpitations, SOB, dizziness, presyncope or syncope with the episode. These sx were attributed to taking Ibuprofen  prior to the episodes occurring, and thought the pain was 2/2 GERD. Cardiac MRI and SAEKG to evaluate for ARVD as well as a GXT to exclude both ischemia and catecholaminergic VT were again recommended, but the pt declined.   She did call her OBGYN on 02/10/13 c/o severe menstrual bleeding. She saw her GYN and was told that her Hgb was low and recommended iron tablets.  An IUD was placed at that time as well. Since that time, her bleeding has resolved.  EKG revealed sinus rhythm with multiple PVCs. CXR was without acute cardiopulmonary abnormalities. Labs are significant for mild leukocytosis to 12.0 and H&H of 8.1/27.8.   Past Medical History  Diagnosis Date  . Premature ventricular contractions   . Obesity   . Tobacco dependence   . Depression   . Substance abuse   . Anxiety      Surgical History:  Past Surgical History  Procedure Laterality Date  . Bilateral tubal ligation  1997  . Tubal ligation        Home Meds: Prior to Admission medications   Medication Sig Start Date End Date Taking? Authorizing Provider  Cyanocobalamin (VITAMIN B-12 CR PO) Take 1 tablet by mouth daily.    Yes Historical Provider, MD  DULoxetine (CYMBALTA) 60 MG capsule Take 60 mg by mouth daily.   Yes Historical Provider, MD  levonorgestrel (MIRENA) 20 MCG/24HR IUD 1 each by Intrauterine route once.   Yes Historical Provider, MD  Multiple Vitamin (MULTIVITAMIN) capsule Take 1 capsule by mouth daily.     Yes Historical Provider, MD  omeprazole (PRILOSEC) 20 MG capsule Take 20 mg by mouth daily.   Yes Historical Provider, MD    Allergies:  Allergies  Allergen Reactions  . Latex     Rash if wears for a long period of time    History   Social History  . Marital Status: Single    Spouse Name: N/A    Number of Children: N/A  . Years of Education: N/A   Occupational History  . Not on file.   Social History Main Topics  . Smoking status: Current Some Day Smoker  . Smokeless tobacco: Not on file     Comment: chantix prescribed today, she is willing to quit  . Alcohol Use: Yes     Comment: rare  . Drug Use: No  . Sexual Activity: Not on file   Other Topics Concern  . Not on file   Social History Narrative   Works full time for Dr [employer].  She is divorced and has 2 children.     Family History  Problem Relation Age of Onset  . Arrhythmia      father had a sudden cardiac arrest at age 63 and was rescucitated 3-4 years ago.  He has an ICD and is followed by Dr Ladona Ridgel.  Paternal grandfather died suddenly at age 74 at home.  . Depression Mother   . Heart disease Father   . ADD / ADHD Son   . Depression Maternal Grandmother   . Cancer Maternal Grandmother   . Depression Maternal Grandfather   . Cancer Paternal Grandmother   . Heart disease Paternal Grandmother   . Heart disease Paternal Grandfather     Review of Systems: A 12 point ROS was performed; pertinent positives and negatives  were noted in the HPI   Physical Exam: Blood pressure 117/54, pulse 92, temperature 98.4 F (36.9 C), temperature source Oral, resp. rate 14, weight 250 lb (113.399 kg), last menstrual period 02/21/2013, SpO2 99.00%. General: Obese female, alert and oriented, in no acute distress. HEENT: Normocephalic, atraumatic, sclera non-icteric, no xanthomas, nares are without discharge.  Neck: Supple. Carotids 2+ without bruits. JVP normal Lungs: Clear bilaterally to auscultation without wheezes, rales, or rhonchi. Breathing is unlabored. Heart: Tachycardic with regular rhythm with normal S1 and S2. No murmurs, rubs, or gallops appreciated. Abdomen:  Soft, non-tender, non-distended with normoactive bowel sounds. No hepatomegaly. No rebound/guarding. No obvious abdominal masses. Back: No CVA tenderness Msk:  Strength and tone appear normal for age. Extremities: No clubbing, cyanosis, or edema.  Distal pedal pulses are 2+ and equal bilaterally. Neuro: CNII-XII intact, moves all extremities spontaneously. Psych:  Responds to questions appropriately with a normal affect.   Labs:   Lab Results  Component Value Date   WBC 12.0* 03/07/2013   HGB 8.1* 03/07/2013   HCT 27.8* 03/07/2013   MCV 75.3* 03/07/2013   PLT 215 03/07/2013    Recent Labs Lab 03/07/13 1123  NA 137  K 4.1  CL 101  CO2 23  BUN 10  CREATININE 0.74  CALCIUM 9.3  GLUCOSE 93   No results found for this basename: CKTOTAL, CKMB, TROPONINI,  in the last 72 hours Lab Results  Component Value Date   CHOL 158 03/28/2012   HDL 37* 03/28/2012   LDLCALC 60 03/28/2012   TRIG 306* 03/28/2012   No results found for this basename: DDIMER    Radiology/Studies:  Dg Chest 2 View  03/07/2013   CLINICAL DATA:  Chest pain, shortness of breath  EXAM: CHEST  2 VIEW  COMPARISON:  10/24/2010  FINDINGS: The heart size and mediastinal contours are within normal limits. Both lungs are clear. The visualized skeletal structures are unremarkable.   IMPRESSION: No active cardiopulmonary disease.   Electronically Signed   By: Ruel Favors M.D.   On: 03/07/2013 11:58     EKG: Sinus rhythm with multiple PVCs  ASSESSMENT AND PLAN:  37yo F with PMH GERD, obesity, tobacco use, and PVCs who presented to [facility] on 03/07/2013 with complaints of chest pain radiating into her right jaw.  1. Chest pain radiating into her R jaw: Pt with chest pain at the angle of her jaws bilaterally and then with subsequent midsternal chest pain radiating into her back with associated SOB and diaphoresis that subsided after with SL NTG. She currently denies any chest pain, SOB, diaphoresis, abdominal pain, nausea or vomiting. She has a h/o of PVCs and similar chest pain and was seen by Dr. Johney Frame who recommended cardiac workup with cardiac MRI and an exercise stress test, which the patient declined. EKG with multiple PVCs but with sinus rhythm. No PVCs on tele monitor during examination. ISTAT troponin was negative. Her pain could be from the PVCs, anemia, GERD, or even gallstones. The fact that it was relieved by the nitroglycerin is concerning for cardiac origin. Pt states that she really does not want to be admitted overnight.  - D/c to home with exercise stress test within 1 week in the office, she will be contacted by [facility] office with the appointment - Continue to take the iron supplementation - Continue the omeprazole daily - Will have the ED give the patient a prescription for SL NGT to take if she has any further chest pain.  - She was instructed to immediately come to the ED for any further episodes of chest pain  2. Obesity:  Pt states that she primarily eats healthy but does not exercise. Her weight is down 12lbs from 11/2010.   3. Tobacco abuse: Pt states that she quit smoking 04/2012 and has not smoked since. She is smoking e-cigarettes.    Signed, Genelle Gather, MD Internal Medicine Resident, PGY II [facility] Internal Medicine  Program 03/07/2013 1:32 PM   Patient seen and examined and history reviewed. Agree with above findings and  plan. 37 yo premenopausal female presents for evaluation of chest pain today. Patient experienced sudden onset of mid substernal chest pain associated with bilateral pain at the angle of her jaw. She graded this with history of TMJ which he has had in the past. The chest pain was more intense than she has experienced previously. It did resolve within 15 minutes after taking an aspirin and sublingual nitroglycerin. She does have a history of chronic PVCs. She has had no prior ischemic evaluation although stress testing was recommended in the past. Her only current cardiac risk factors include history of obesity and family history of coronary disease. She quit smoking in January 2014. On exam she is a morbidly obese white female in no acute distress. Lungs are clear. Cardiac exam reveals a regular rate and rhythm without gallop or murmur. She has no edema. ECG demonstrates normal sinus rhythm with unifocal PVCs. It is otherwise normal. Initial cardiac troponin is normal. Her hemoglobin is down to 8.1. Overall I feel that her cardiovascular risk is low. I recommended she continue on Prilosec for GERD. I recommended she take her iron for her anemia. She had an IUD recently place for heavy menses. To further assess her cardiac risk I recommended an outpatient stress Myoview study. We will arrange this within the next week. We will have her pick up a prescription for sublingual nitroglycerin. If she has recurrent chest pain she is directed to return to the emergency room.  Theron Arista [facility] 03/07/2013 3:00 PM

## 2013-03-08 ENCOUNTER — Telehealth: Payer: Self-pay | Admitting: Internal Medicine

## 2013-03-08 DIAGNOSIS — R0789 Other chest pain: Secondary | ICD-10-CM

## 2013-03-08 NOTE — Telephone Encounter (Signed)
Yes she needs an outpatient stress myoview scheduled.  Peter Swaziland MD, [facility]

## 2013-03-08 NOTE — Telephone Encounter (Signed)
New problem   Pt went to ER 11/10 and per Dr Elvis Coil notes:   To further assess her cardiac risk I recommended an outpatient stress Myoview study.    No orders in the system for me to schedule this test not sure what type of Stress myo.   Starting 11/12 pt back to work please leave voice mail on her cell #[phone]

## 2013-03-08 NOTE — Telephone Encounter (Signed)
Dr. Swaziland, I have reviewed patient's chart; there are no orders for this patient in epic.  Would you like [place] to schedule an appointment for this patient and/or order tests?  Please advise

## 2013-03-09 ENCOUNTER — Telehealth: Payer: Self-pay | Admitting: Internal Medicine

## 2013-03-09 NOTE — Telephone Encounter (Signed)
myoview 03/30/13 @ 8:15

## 2013-03-09 NOTE — Telephone Encounter (Signed)
Spoke with patient.  Patient is aware that test has been ordered and that she will receive a call from someone in scheduling to set up date/time for test.

## 2013-03-09 NOTE — Telephone Encounter (Signed)
Error - wrong provider

## 2013-03-09 NOTE — Telephone Encounter (Signed)
Follow Up   Calling for an ordered stress test// please return call.

## 2013-03-09 NOTE — Addendum Note (Signed)
Addended by: Levi Aland on: 03/09/2013 08:33 AM   Modules accepted: Orders

## 2013-03-09 NOTE — Telephone Encounter (Signed)
Left message for patient to call us with receptionist at patient's workplace

## 2013-03-30 ENCOUNTER — Ambulatory Visit (HOSPITAL_COMMUNITY): Payer: 59 | Attending: Cardiology | Admitting: Radiology

## 2013-03-30 VITALS — BP 112/91 | HR 93 | Ht 65.0 in | Wt 272.0 lb

## 2013-03-30 DIAGNOSIS — R0789 Other chest pain: Secondary | ICD-10-CM

## 2013-03-30 DIAGNOSIS — J45909 Unspecified asthma, uncomplicated: Secondary | ICD-10-CM | POA: Insufficient documentation

## 2013-03-30 DIAGNOSIS — R0602 Shortness of breath: Secondary | ICD-10-CM

## 2013-03-30 DIAGNOSIS — F17200 Nicotine dependence, unspecified, uncomplicated: Secondary | ICD-10-CM | POA: Insufficient documentation

## 2013-03-30 DIAGNOSIS — Z8249 Family history of ischemic heart disease and other diseases of the circulatory system: Secondary | ICD-10-CM | POA: Insufficient documentation

## 2013-03-30 DIAGNOSIS — R079 Chest pain, unspecified: Secondary | ICD-10-CM

## 2013-03-30 DIAGNOSIS — R002 Palpitations: Secondary | ICD-10-CM | POA: Insufficient documentation

## 2013-03-30 MED ORDER — TECHNETIUM TC 99M SESTAMIBI GENERIC - CARDIOLITE
33.0000 | Freq: Once | INTRAVENOUS | Status: AC | PRN
  Administered 2013-03-30: 33 via INTRAVENOUS

## 2013-03-30 NOTE — Progress Notes (Signed)
[facility] SITE 3 NUCLEAR MED [address] [place], [place] [address] [phone]    Cardiology Nuclear Med Study  Valerie Shannon is a 37 y.o. female     MRN : [id number]     DOB: [birth year]  Procedure Date: 03/30/2013  Nuclear Med Background Indication for Stress Test:  Evaluation for Ischemia and Post Hospital 11/14 CP History:  Asthma and No known CAD, Echo 2011 EF 55-60% Cardiac Risk Factors: Family History - CAD and Smoker  Symptoms:  Chest Pain (last date of chest discomfort was three weeks ago) and Palpitations   Nuclear Pre-Procedure Caffeine/Decaff Intake:  None NPO After: 9:00pm   Lungs:  clear O2 Sat: 98% on room air. IV 0.9% NS with Angio Cath:  22g  IV Site: R Hand  IV Started by:  Bonnita Levan, RN  Chest Size (in):  42 Cup Size: DD  Height: 5\' 5"  (1.651 m)  Weight:  272 lb (123.378 kg)  BMI:  Body mass index is 45.26 kg/(m^2). Tech Comments:  N/A    Nuclear Med Study 1 or 2 day study: 2 day  Stress Test Type:  Stress  Reading MD: Cassell Clement, MD  Order Authorizing Provider: Peter Swaziland, MD  Resting Radionuclide: Technetium 33m Sestamibi  Resting Radionuclide Dose: 33.0 mCi  04/05/13  Stress Radionuclide:  Technetium 56m Sestamibi  Stress Radionuclide Dose: 33.0 mCi  03/30/13          Stress Protocol Rest HR: 93 Stress HR: 157  Rest BP: 112/91 Stress BP: 217/83  Exercise Time (min): 5:30 METS: 7.0           Dose of Adenosine (mg):  n/a Dose of Lexiscan: n/a mg  Dose of Atropine (mg): n/a Dose of Dobutamine: n/a mcg/kg/min (at max HR)  Stress Test Technologist: Nelson Chimes, BS-ES  Nuclear Technologist:  Domenic Polite, CNMT     Rest Procedure:  Myocardial perfusion imaging was performed at rest 45 minutes following the intravenous administration of Technetium 31m Sestamibi. Rest ECG: Normal sinus rhythm. Normal QRS with PVCs.  Stress Procedure:  The patient exercised on the treadmill utilizing the Bruce Protocol for 5:30 minutes.  The patient stopped due to fatigue, leg burning and denied any chest pain.  Technetium 39m Sestamibi was injected at peak exercise and myocardial perfusion imaging was performed after a brief delay. Stress ECG: No significant change from baseline ECG  QPS Raw Data Images:  Normal; no motion artifact; normal heart/lung ratio. Stress Images:  Normal homogeneous uptake in all areas of the myocardium. Rest Images:  Normal homogeneous uptake in all areas of the myocardium. Subtraction (SDS):  No evidence of ischemia. Transient Ischemic Dilatation (Normal <1.22):  1.12 Lung/Heart Ratio (Normal <0.45):  0.24  Quantitative Gated Spect Images QGS EDV:  n/a ml QGS ESV:  n/a ml  Impression Exercise Capacity:  Poor exercise capacity. BP Response:  There was a hypertensive blood pressure response Clinical Symptoms:  Fatigue ECG Impression:  No significant ST segment change suggestive of ischemia. Comparison with Prior Nuclear Study: No images to compare  Overall Impression:  There is no significant abnormality noted. There is no scar or ischemia. This is a low risk scan. The patient had multiple PVCs at rest. PVCs decreased at peak stress.  LV Ejection Fraction: Study not gated.  LV Wall Motion:    There is no wall motion information because the study could not be gated.  Willa Rough, MD

## 2013-04-04 ENCOUNTER — Encounter (HOSPITAL_COMMUNITY): Payer: 59

## 2013-04-05 ENCOUNTER — Ambulatory Visit (HOSPITAL_COMMUNITY): Payer: 59 | Attending: Cardiology

## 2013-04-05 DIAGNOSIS — R0989 Other specified symptoms and signs involving the circulatory and respiratory systems: Secondary | ICD-10-CM

## 2013-04-05 DIAGNOSIS — R079 Chest pain, unspecified: Secondary | ICD-10-CM

## 2013-04-05 MED ORDER — TECHNETIUM TC 99M SESTAMIBI GENERIC - CARDIOLITE
33.0000 | Freq: Once | INTRAVENOUS | Status: AC | PRN
  Administered 2013-04-05: 33 via INTRAVENOUS

## 2013-04-08 ENCOUNTER — Telehealth: Payer: Self-pay | Admitting: Cardiology

## 2013-04-08 NOTE — Telephone Encounter (Signed)
Returned call to patient myoview results given. 

## 2013-04-08 NOTE — Telephone Encounter (Signed)
Follow Up  Pt calling for results//SR

## 2013-05-06 ENCOUNTER — Telehealth: Payer: Self-pay

## 2013-05-06 NOTE — Telephone Encounter (Signed)
Patient states she needs a refill for cymbalta. She says last time we were supposed to give her enough for 60mg /day so that her mail order can send enough for 2 months. If she only gets the 40 mg/day she runs out before the 2 months are up and she has to pay extra for the refill.

## 2013-05-07 NOTE — Telephone Encounter (Signed)
Can we fix this?

## 2013-05-09 ENCOUNTER — Telehealth: Payer: Self-pay

## 2013-05-09 NOTE — Telephone Encounter (Signed)
LM for pt to RTC-  

## 2013-05-09 NOTE — Telephone Encounter (Signed)
Needs office visit, has not been seen in over 1 yr

## 2013-05-09 NOTE — Telephone Encounter (Signed)
Pt states she needs a refill on cymbalta because the dr she saw last stated she can increase her dose from 40mg  to 60mg 

## 2013-05-10 ENCOUNTER — Other Ambulatory Visit: Payer: Self-pay | Admitting: Family Medicine

## 2013-05-10 DIAGNOSIS — F329 Major depressive disorder, single episode, unspecified: Secondary | ICD-10-CM

## 2013-05-10 DIAGNOSIS — F32A Depression, unspecified: Secondary | ICD-10-CM

## 2013-05-10 MED ORDER — DULOXETINE HCL 60 MG PO CPEP: 60.0000 mg | ORAL_CAPSULE | Freq: Every day | ORAL | Status: DC

## 2014-01-20 ENCOUNTER — Ambulatory Visit (INDEPENDENT_AMBULATORY_CARE_PROVIDER_SITE_OTHER): Payer: 59 | Admitting: Emergency Medicine

## 2014-01-20 VITALS — BP 126/68 | HR 59 | Temp 98.3°F | Resp 16 | Ht 64.5 in | Wt 242.0 lb

## 2014-01-20 DIAGNOSIS — A6009 Herpesviral infection of other urogenital tract: Secondary | ICD-10-CM

## 2014-01-20 DIAGNOSIS — Z202 Contact with and (suspected) exposure to infections with a predominantly sexual mode of transmission: Secondary | ICD-10-CM

## 2014-01-20 DIAGNOSIS — A6000 Herpesviral infection of urogenital system, unspecified: Secondary | ICD-10-CM

## 2014-01-20 LAB — POCT URINALYSIS DIPSTICK
Bilirubin, UA: NEGATIVE
Blood, UA: NEGATIVE
GLUCOSE UA: NEGATIVE
Ketones, UA: NEGATIVE
Leukocytes, UA: NEGATIVE
NITRITE UA: NEGATIVE
PROTEIN UA: NEGATIVE
SPEC GRAV UA: 1.025
UROBILINOGEN UA: 0.2
pH, UA: 5

## 2014-01-20 LAB — POCT UA - MICROSCOPIC ONLY
Bacteria, U Microscopic: NEGATIVE
Casts, Ur, LPF, POC: NEGATIVE
Crystals, Ur, HPF, POC: NEGATIVE
Mucus, UA: NEGATIVE
WBC, UR, HPF, POC: NEGATIVE
YEAST UA: NEGATIVE

## 2014-01-20 MED ORDER — VALACYCLOVIR HCL 1 G PO TABS: 1000.0000 mg | ORAL_TABLET | Freq: Every day | ORAL | Status: DC

## 2014-01-20 MED ORDER — VALACYCLOVIR HCL 1 G PO TABS
1000.0000 mg | ORAL_TABLET | Freq: Two times a day (BID) | ORAL | Status: DC
Start: 2014-01-20 — End: 2014-02-01

## 2014-01-20 NOTE — Progress Notes (Signed)
[facility] [address] [place] [address] [phone]  Date:  01/20/2014   Name:  Valerie Shannon   DOB:  Mar 22, 1976   MRN:  [id number]  PCP:  No PCP Per Patient    Chief Complaint: Exposure to STD   History of Present Illness:  Valerie Shannon is a 38 y.o. very pleasant female patient who presents with the following:  Told by her ex that he has herpes and she has an outbreak of painful vesicles on her introitus. No drainage or dyspareunia.   No dysuria, urgency or frequency No improvement with over the counter medications or other home remedies.  Denies other complaint or health concern today.   Patient Active Problem List   Diagnosis Date Noted  . Chest pain 12/11/2010  . Tobacco abuse 12/11/2010  . PREMATURE VENTRICULAR CONTRACTIONS, FREQUENT 05/26/2010  . OVERWEIGHT/OBESITY 05/23/2010  . PALPITATIONS 05/23/2010    Past Medical History  Diagnosis Date  . Premature ventricular contractions   . Obesity   . Tobacco dependence   . Depression   . Substance abuse   . Anxiety     Past Surgical History  Procedure Laterality Date  . Bilateral tubal ligation  1997  . Tubal ligation      History  Substance Use Topics  . Smoking status: Former Shannon developer  . Smokeless tobacco: Not on file     Comment: chantix prescribed today, she is willing to quit  . Alcohol Use: Yes     Comment: rare    Family History  Problem Relation Age of Onset  . Arrhythmia      father had a sudden cardiac arrest at age 34 and was rescucitated 3-4 years ago.  He has an ICD and is followed by Dr Ladona Ridgel.  Paternal grandfather died suddenly at age 83 at home.  . Depression Mother   . Heart disease Father   . ADD / ADHD Son   . Depression Maternal Grandmother   . Cancer Maternal Grandmother   . Depression Maternal Grandfather   . Cancer Paternal Grandmother   . Heart disease Paternal Grandmother   . Heart disease Paternal Grandfather   . Heart failure Father      Allergies  Allergen Reactions  . Latex     Rash if wears for a long period of time    Medication list has been reviewed and updated.  Current Outpatient Prescriptions on File Prior to Visit  Medication Sig Dispense Refill  . Cyanocobalamin (VITAMIN B-12 CR PO) Take 1 tablet by mouth daily.       . DULoxetine (CYMBALTA) 60 MG capsule Take 1 capsule (60 mg total) by mouth daily.  60 capsule  5  . levonorgestrel (MIRENA) 20 MCG/24HR IUD 1 each by Intrauterine route once.      . Multiple Vitamin (MULTIVITAMIN) capsule Take 1 capsule by mouth daily.        Marland Kitchen omeprazole (PRILOSEC) 20 MG capsule Take 20 mg by mouth daily.      . nitroGLYCERIN (NITROSTAT) 0.4 MG SL tablet Place 1 tablet (0.4 mg total) under the tongue every 5 (five) minutes as needed for chest pain.  30 tablet  12   No current facility-administered medications on file prior to visit.    Review of Systems:  As per HPI, otherwise negative.    Physical Examination: Filed Vitals:   01/20/14 1229  BP: 126/68  Pulse: 59  Temp: 98.3 F (36.8 C)  Resp: 16  Filed Vitals:   01/20/14 1229  Height: 5' 4.5" (1.638 m)  Weight: 242 lb (109.77 kg)   Body mass index is 40.91 kg/(m^2). Ideal Body Weight: Weight in (lb) to have BMI = 25: 147.6   GEN: WDWN, NAD, Non-toxic, Alert & Oriented x 3 HEENT: Atraumatic, Normocephalic.  Ears and Nose: No external deformity. EXTR: No clubbing/cyanosis/edema NEURO: Normal gait.  PSYCH: Normally interactive. Conversant. Not depressed or anxious appearing.  Calm demeanor.  Genitalia:  Eruption consistent with herpes   Assessment and Plan: Genital herpes Valtrex  Signed,  Phillips Odor, MD

## 2014-01-20 NOTE — Patient Instructions (Signed)
herHerpes Labialis You have a fever blister or cold sore (herpes labialis). These painful, grouped sores are caused by one of the herpes viruses (HSV1 most commonly). They are usually found around the lips and mouth, but the same infection can also affect other areas on the face such as the nose and eyes. Herpes infections take about 10 days to heal. They often occur again and again in the same spot. Other symptoms may include numbness and tingling in the involved skin, achiness, fever, and swollen glands in the neck. Colds, emotional stress, injuries, or excess sunlight exposure all seem to make herpes reappear. Herpes lip infections are contagious. Direct contact with these sores can spread the infection. It can also be spread to other parts of your own body. TREATMENT  Herpes labialis is usually self-limited and resolves within 1 week. To reduce pain and swelling, apply ice packs frequently to the sores or suck on popsicles or frozen juice bars. Antiviral medicine may be used by mouth to shorten the duration of the breakout. Avoid spreading the infection by washing your hands often. Be careful not to touch your eyes or genital areas after handling the infected blisters. Do not kiss or have other intimate contact with others. After the blisters are completely healed you may resume contact. Use sunscreen to lessen recurrences.  If this is your first infection with herpes, or if you have a severe or repeated infections, your caregiver may prescribe one of the anti-viral drugs to speed up the healing. If you have sun-related flare-ups despite the use of sunscreen, starting oral anti-viral medicine before a prolonged exposure (going skiing or to the beach) can prevent most episodes.  SEEK IMMEDIATE MEDICAL CARE IF:  You develop a headache, sleepiness, high fever, vomiting, or severe weakness.  You have eye irritation, pain, blurred vision or redness.  You develop a prolonged infection not getting better in  10 days. Document Released: 04/14/2005 Document Revised: 07/07/2011 Document Reviewed: 02/16/2009 [facility] Patient Information 2015 [place], [place]. This information is not intended to replace advice given to you by your health care provider. Make sure you discuss any questions you have with your health care provider.

## 2014-02-01 ENCOUNTER — Ambulatory Visit (INDEPENDENT_AMBULATORY_CARE_PROVIDER_SITE_OTHER): Payer: 59 | Admitting: Family Medicine

## 2014-02-01 ENCOUNTER — Emergency Department (HOSPITAL_COMMUNITY): Payer: 59

## 2014-02-01 ENCOUNTER — Emergency Department (HOSPITAL_COMMUNITY)
Admission: EM | Admit: 2014-02-01 | Discharge: 2014-02-01 | Disposition: A | Discharge status: Home / Self Care | Payer: 59 | Attending: Emergency Medicine | Admitting: Emergency Medicine

## 2014-02-01 ENCOUNTER — Encounter (HOSPITAL_COMMUNITY): Payer: Self-pay | Admitting: Emergency Medicine

## 2014-02-01 VITALS — BP 132/88 | HR 54 | Temp 98.8°F | Resp 22 | Ht 65.5 in | Wt 246.0 lb

## 2014-02-01 DIAGNOSIS — Z9851 Tubal ligation status: Secondary | ICD-10-CM | POA: Insufficient documentation

## 2014-02-01 DIAGNOSIS — E669 Obesity, unspecified: Secondary | ICD-10-CM | POA: Diagnosis not present

## 2014-02-01 DIAGNOSIS — R000 Tachycardia, unspecified: Secondary | ICD-10-CM | POA: Insufficient documentation

## 2014-02-01 DIAGNOSIS — N390 Urinary tract infection, site not specified: Secondary | ICD-10-CM | POA: Insufficient documentation

## 2014-02-01 DIAGNOSIS — R102 Pelvic and perineal pain: Secondary | ICD-10-CM | POA: Diagnosis not present

## 2014-02-01 DIAGNOSIS — Z3202 Encounter for pregnancy test, result negative: Secondary | ICD-10-CM | POA: Diagnosis not present

## 2014-02-01 DIAGNOSIS — Z87891 Personal history of nicotine dependence: Secondary | ICD-10-CM | POA: Diagnosis not present

## 2014-02-01 DIAGNOSIS — F329 Major depressive disorder, single episode, unspecified: Secondary | ICD-10-CM | POA: Diagnosis not present

## 2014-02-01 DIAGNOSIS — R110 Nausea: Secondary | ICD-10-CM | POA: Diagnosis not present

## 2014-02-01 DIAGNOSIS — N730 Acute parametritis and pelvic cellulitis: Secondary | ICD-10-CM

## 2014-02-01 DIAGNOSIS — R1031 Right lower quadrant pain: Secondary | ICD-10-CM | POA: Diagnosis present

## 2014-02-01 DIAGNOSIS — Z91040 Latex allergy status: Secondary | ICD-10-CM | POA: Diagnosis not present

## 2014-02-01 DIAGNOSIS — F419 Anxiety disorder, unspecified: Secondary | ICD-10-CM | POA: Diagnosis not present

## 2014-02-01 DIAGNOSIS — Z8679 Personal history of other diseases of the circulatory system: Secondary | ICD-10-CM | POA: Diagnosis not present

## 2014-02-01 DIAGNOSIS — R6883 Chills (without fever): Secondary | ICD-10-CM | POA: Insufficient documentation

## 2014-02-01 DIAGNOSIS — Z79899 Other long term (current) drug therapy: Secondary | ICD-10-CM | POA: Insufficient documentation

## 2014-02-01 DIAGNOSIS — R1030 Lower abdominal pain, unspecified: Secondary | ICD-10-CM

## 2014-02-01 LAB — I-STAT CHEM 8, ED
BUN: 9 mg/dL (ref 6–23)
CREATININE: 0.6 mg/dL (ref 0.50–1.10)
Calcium, Ion: 1.08 mmol/L — ABNORMAL LOW (ref 1.12–1.23)
Chloride: 102 mEq/L (ref 96–112)
GLUCOSE: 87 mg/dL (ref 70–99)
HCT: 42 % (ref 36.0–46.0)
HEMOGLOBIN: 14.3 g/dL (ref 12.0–15.0)
POTASSIUM: 3.4 meq/L — AB (ref 3.7–5.3)
Sodium: 138 mEq/L (ref 137–147)
TCO2: 21 mmol/L (ref 0–100)

## 2014-02-01 LAB — CBC
HEMATOCRIT: 38.6 % (ref 36.0–46.0)
Hemoglobin: 12.1 g/dL (ref 12.0–15.0)
MCH: 27.4 pg (ref 26.0–34.0)
MCHC: 31.3 g/dL (ref 30.0–36.0)
MCV: 87.3 fL (ref 78.0–100.0)
Platelets: 176 10*3/uL (ref 150–400)
RBC: 4.42 MIL/uL (ref 3.87–5.11)
RDW: 17 % — ABNORMAL HIGH (ref 11.5–15.5)
WBC: 19 10*3/uL — AB (ref 4.0–10.5)

## 2014-02-01 LAB — POCT WET PREP WITH KOH
CLUE CELLS WET PREP PER HPF POC: NEGATIVE
KOH Prep POC: NEGATIVE
TRICHOMONAS UA: NEGATIVE
Yeast Wet Prep HPF POC: NEGATIVE

## 2014-02-01 LAB — POCT CBC
Granulocyte percent: 87.1 %G — AB (ref 37–80)
HCT, POC: 40.3 % (ref 37.7–47.9)
Hemoglobin: 12.7 g/dL (ref 12.2–16.2)
Lymph, poc: 2.4 (ref 0.6–3.4)
MCH: 27.1 pg (ref 27–31.2)
MCHC: 31.4 g/dL — AB (ref 31.8–35.4)
MCV: 86.3 fL (ref 80–97)
MID (cbc): 0.2 (ref 0–0.9)
MPV: 8.3 fL (ref 0–99.8)
POC Granulocyte: 17.9 — AB (ref 2–6.9)
POC LYMPH PERCENT: 11.8 %L (ref 10–50)
POC MID %: 1.1 %M (ref 0–12)
Platelet Count, POC: 194 10*3/uL (ref 142–424)
RBC: 4.66 M/uL (ref 4.04–5.48)
RDW, POC: 18.9 %
WBC: 20.6 10*3/uL — AB (ref 4.6–10.2)

## 2014-02-01 LAB — POC URINE PREG, ED: Preg Test, Ur: NEGATIVE

## 2014-02-01 LAB — BASIC METABOLIC PANEL
ANION GAP: 12 (ref 5–15)
BUN: 11 mg/dL (ref 6–23)
CHLORIDE: 103 meq/L (ref 96–112)
CO2: 24 meq/L (ref 19–32)
Calcium: 8.7 mg/dL (ref 8.4–10.5)
Creatinine, Ser: 0.65 mg/dL (ref 0.50–1.10)
GFR calc Af Amer: >90 mL/min (ref >90)
GFR calc non Af Amer: >90 mL/min (ref >90)
GLUCOSE: 89 mg/dL (ref 70–99)
Potassium: 3.7 mEq/L (ref 3.7–5.3)
Sodium: 139 mEq/L (ref 137–147)

## 2014-02-01 LAB — POCT URINALYSIS DIPSTICK
Bilirubin, UA: NEGATIVE
GLUCOSE UA: NEGATIVE
Ketones, UA: NEGATIVE
Nitrite, UA: NEGATIVE
Protein, UA: NEGATIVE
SPEC GRAV UA: 1.02
UROBILINOGEN UA: 0.2
pH, UA: 5

## 2014-02-01 LAB — POCT UA - MICROSCOPIC ONLY
BACTERIA, U MICROSCOPIC: NEGATIVE
Casts, Ur, LPF, POC: NEGATIVE
Crystals, Ur, HPF, POC: NEGATIVE
Mucus, UA: NEGATIVE
YEAST UA: NEGATIVE

## 2014-02-01 LAB — COMPREHENSIVE METABOLIC PANEL
ALBUMIN: 4 g/dL (ref 3.5–5.2)
ALT: 13 U/L (ref 0–35)
AST: 16 U/L (ref 0–37)
Alkaline Phosphatase: 73 U/L (ref 39–117)
BILIRUBIN TOTAL: 0.5 mg/dL (ref 0.2–1.2)
BUN: 11 mg/dL (ref 6–23)
CO2: 24 meq/L (ref 19–32)
Calcium: 8.5 mg/dL (ref 8.4–10.5)
Chloride: 103 mEq/L (ref 96–112)
Creat: 0.69 mg/dL (ref 0.50–1.10)
GLUCOSE: 82 mg/dL (ref 70–99)
Potassium: 4.2 mEq/L (ref 3.5–5.3)
SODIUM: 136 meq/L (ref 135–145)
Total Protein: 7 g/dL (ref 6.0–8.3)

## 2014-02-01 LAB — URINALYSIS, ROUTINE W REFLEX MICROSCOPIC
Bilirubin Urine: NEGATIVE
Glucose, UA: NEGATIVE mg/dL
Ketones, ur: NEGATIVE mg/dL
NITRITE: NEGATIVE
PH: 5 (ref 5.0–8.0)
Protein, ur: 100 mg/dL — AB
SPECIFIC GRAVITY, URINE: 1.021 (ref 1.005–1.030)
Urobilinogen, UA: 0.2 mg/dL (ref 0.0–1.0)

## 2014-02-01 LAB — URINE MICROSCOPIC-ADD ON

## 2014-02-01 LAB — POC OCCULT BLOOD, ED: FECAL OCCULT BLD: POSITIVE — AB

## 2014-02-01 LAB — POCT URINE PREGNANCY: Preg Test, Ur: NEGATIVE

## 2014-02-01 LAB — LIPASE, BLOOD: LIPASE: 19 U/L (ref 11–59)

## 2014-02-01 MED ORDER — IOHEXOL 300 MG/ML  SOLN
50.0000 mL | Freq: Once | INTRAMUSCULAR | Status: AC | PRN
  Administered 2014-02-01: 50 mL via ORAL

## 2014-02-01 MED ORDER — SODIUM CHLORIDE 0.9 % IV BOLUS (SEPSIS)
1000.0000 mL | Freq: Once | INTRAVENOUS | Status: AC
  Administered 2014-02-01: 1000 mL via INTRAVENOUS

## 2014-02-01 MED ORDER — PROMETHAZINE HCL 25 MG/ML IJ SOLN
25.0000 mg | Freq: Once | INTRAMUSCULAR | Status: AC
  Administered 2014-02-01: 25 mg via INTRAMUSCULAR

## 2014-02-01 MED ORDER — DOXYCYCLINE HYCLATE 100 MG PO CAPS: 100.0000 mg | ORAL_CAPSULE | Freq: Two times a day (BID) | ORAL | Status: DC

## 2014-02-01 MED ORDER — ONDANSETRON HCL 4 MG PO TABS: 4.0000 mg | ORAL_TABLET | Freq: Four times a day (QID) | ORAL | Status: DC

## 2014-02-01 MED ORDER — LIDOCAINE HCL (PF) 1 % IJ SOLN: 5.0000 mL | Freq: Once | INTRAMUSCULAR | Status: DC

## 2014-02-01 MED ORDER — METRONIDAZOLE 500 MG PO TABS: 500.0000 mg | ORAL_TABLET | Freq: Two times a day (BID) | ORAL | Status: DC

## 2014-02-01 MED ORDER — ONDANSETRON HCL 4 MG/2ML IJ SOLN
4.0000 mg | Freq: Once | INTRAMUSCULAR | Status: AC
  Administered 2014-02-01: 4 mg via INTRAVENOUS
  Filled 2014-02-01: qty 2

## 2014-02-01 MED ORDER — HYDROCODONE-ACETAMINOPHEN 5-325 MG PO TABS
1.0000 | ORAL_TABLET | Freq: Four times a day (QID) | ORAL | Status: DC | PRN
Start: 2014-02-01 — End: 2014-02-04

## 2014-02-01 MED ORDER — IOHEXOL 300 MG/ML  SOLN
100.0000 mL | Freq: Once | INTRAMUSCULAR | Status: AC | PRN
  Administered 2014-02-01: 100 mL via INTRAVENOUS

## 2014-02-01 MED ORDER — DEXTROSE 5 % IV SOLN
1.0000 g | Freq: Once | INTRAVENOUS | Status: AC
  Administered 2014-02-01: 1 g via INTRAVENOUS
  Filled 2014-02-01: qty 10

## 2014-02-01 MED ORDER — CEPHALEXIN 500 MG PO CAPS: 500.0000 mg | ORAL_CAPSULE | Freq: Four times a day (QID) | ORAL | Status: DC

## 2014-02-01 MED ORDER — HYDROMORPHONE HCL 1 MG/ML IJ SOLN
1.0000 mg | Freq: Once | INTRAMUSCULAR | Status: AC
  Administered 2014-02-01: 1 mg via INTRAVENOUS
  Filled 2014-02-01: qty 1

## 2014-02-01 MED ORDER — OXYCODONE-ACETAMINOPHEN 5-325 MG PO TABS: 1.0000 | ORAL_TABLET | Freq: Four times a day (QID) | ORAL | Status: DC | PRN

## 2014-02-01 MED ORDER — MORPHINE SULFATE 4 MG/ML IJ SOLN
4.0000 mg | Freq: Once | INTRAMUSCULAR | Status: AC
  Administered 2014-02-01: 4 mg via INTRAVENOUS
  Filled 2014-02-01: qty 1

## 2014-02-01 NOTE — ED Notes (Signed)
Pt sent by PCP w/ WBC 20,000.  C/o lower abdominal pain and nausea x 1 day.

## 2014-02-01 NOTE — Progress Notes (Addendum)
[facility] [address] [place] [address] [phone]  Date:  02/01/2014   Name:  Valerie Shannon   DOB:  [birth year]   MRN:  [id number]  PCP:  No PCP Per Patient    Chief Complaint: Abdominal Pain, Nausea and blood in stool   History of Present Illness:  Valerie Shannon is a 38 y.o. very pleasant female patient who presents with the following:  She notes "severe abdominal pains" that started yesterday am.  Her menses started yesterday but she has a mirena so she has very little bleeding. She does not think this is menstrual cramps.  She has nausea but no vomiting.  She does not much feel like eating. No diarrhea. This am she tried to have a BM but she had too much pain in her abdomen to continue. She has had a tubal ligation but no other operations.    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  This did help but wore off last night.   She has not noted any urinary sx- no blood in her urine, no dysuria.  She has had a kidney stone in the past but it seemed different than the pain she has now She did notice pain with any movement/ riding over the in the car was painful to her  Patient Active Problem List   Diagnosis Date Noted  . Chest pain 12/11/2010  . Tobacco abuse 12/11/2010  . PREMATURE VENTRICULAR CONTRACTIONS, FREQUENT 05/26/2010  . OVERWEIGHT/OBESITY 05/23/2010  . PALPITATIONS 05/23/2010    Past Medical History  Diagnosis Date  . Premature ventricular contractions   . Obesity   . Tobacco dependence   . Depression   . Substance abuse   . Anxiety     Past Surgical History  Procedure Laterality Date  . Bilateral tubal ligation  1997  . Tubal ligation      History  Substance Use Topics  . Smoking status: Former Games developer  . Smokeless tobacco: Not on file     Comment: chantix prescribed today, she is willing to quit  . Alcohol Use: Yes     Comment: rare    Family History   Problem Relation Age of Onset  . Arrhythmia      father had a sudden cardiac arrest at age 58 and was rescucitated 3-4 years ago.  He has an ICD and is followed by Dr Ladona Ridgel.  Paternal grandfather died suddenly at age 41 at home.  . Depression Mother   . Heart disease Father   . ADD / ADHD Son   . Depression Maternal Grandmother   . Cancer Maternal Grandmother   . Depression Maternal Grandfather   . Cancer Paternal Grandmother   . Heart disease Paternal Grandmother   . Heart disease Paternal Grandfather   . Heart failure Father     Allergies  Allergen Reactions  . Latex     Rash if wears for a long period of time    Medication list has been reviewed and updated.  Current Outpatient Prescriptions on File Prior to Visit  Medication Sig Dispense Refill  . Cyanocobalamin (VITAMIN B-12 CR PO) Take 1 tablet by mouth daily.       . DULoxetine (CYMBALTA) 60 MG capsule Take 1 capsule (60 mg total) by mouth daily.  60 capsule  5  . levonorgestrel (MIRENA) 20 MCG/24HR IUD 1 each by Intrauterine route once.      Marland Kitchen  Multiple Vitamin (MULTIVITAMIN) capsule Take 1 capsule by mouth daily.        Marland Kitchen. omeprazole (PRILOSEC) 20 MG capsule Take 20 mg by mouth daily.      . valACYclovir (VALTREX) 1000 MG tablet Take 1 tablet (1,000 mg total) by mouth daily.  30 tablet  2  . valACYclovir (VALTREX) 1000 MG tablet Take 1 tablet (1,000 mg total) by mouth 2 (two) times daily.  90 tablet  3   No current facility-administered medications on file prior to visit.    Review of Systems:  As per HPI- otherwise negative.   Physical Examination: Filed Vitals:   02/01/14 1019  BP: 132/88  Pulse: 54  Temp: 98.8 F (37.1 C)  Resp: 22   Filed Vitals:   02/01/14 1019  Height: 5' 5.5" (1.664 m)  Weight: 246 lb (111.585 kg)   Body mass index is 40.3 kg/(m^2). Ideal Body Weight: Weight in (lb) to have BMI = 25: 152.2  GEN: WDWN, NAD, Non-toxic, A & O x 3, obese, looks well but appears to be in pain with  movement HEENT: Atraumatic, Normocephalic. Neck supple. No masses, No LAD. Ears and Nose: No external deformity. CV: RRR, No M/G/R. No JVD. No thrill. No extra heart sounds. PULM: CTA B, no wheezes, crackles, rhonchi. No retractions. No resp. distress. No accessory muscle use. ABD: S, NT, ND, +BS. No rebound. No HSM.  She is tender over the suprapubic area. Tender to table jar.  No guarding EXTR: No c/c/e NEURO Normal gait.  PSYCH: Normally interactive. Conversant. Not depressed or anxious appearing.  Calm demeanor.  Gu:  mirena in place.  No CMT but she is tender with pressure over the suprapubic area  Given IM phenergan 25mg  for pain and nausea.  Her son is here to drive her to the ER.    Results for orders placed in visit on 02/01/14  POCT CBC      Result Value Ref Range   WBC 20.6 (*) 4.6 - 10.2 K/uL   Lymph, poc 2.4  0.6 - 3.4   POC LYMPH PERCENT 11.8  10 - 50 %L   MID (cbc) 0.2  0 - 0.9   POC MID % 1.1  0 - 12 %M   POC Granulocyte 17.9 (*) 2 - 6.9   Granulocyte percent 87.1 (*) 37 - 80 %G   RBC 4.66  4.04 - 5.48 M/uL   Hemoglobin 12.7  12.2 - 16.2 g/dL   HCT, POC 78.440.3  69.637.7 - 47.9 %   MCV 86.3  80 - 97 fL   MCH, POC 27.1  27 - 31.2 pg   MCHC 31.4 (*) 31.8 - 35.4 g/dL   RDW, POC 29.518.9     Platelet Count, POC 194  142 - 424 K/uL   MPV 8.3  0 - 99.8 fL  POCT UA - MICROSCOPIC ONLY      Result Value Ref Range   WBC, Ur, HPF, POC 3-5     RBC, urine, microscopic 6-8     Bacteria, U Microscopic neg     Mucus, UA neg     Epithelial cells, urine per micros 2-4     Crystals, Ur, HPF, POC neg     Casts, Ur, LPF, POC neg     Yeast, UA neg    POCT URINALYSIS DIPSTICK      Result Value Ref Range   Color, UA yellow     Clarity, UA clear     Glucose, UA  neg     Bilirubin, UA neg     Ketones, UA neg     Spec Grav, UA 1.020     Blood, UA moderate     pH, UA 5.0     Protein, UA neg     Urobilinogen, UA 0.2     Nitrite, UA neg     Leukocytes, UA Trace    POCT URINE PREGNANCY       Result Value Ref Range   Preg Test, Ur Negative    POCT WET PREP WITH KOH      Result Value Ref Range   Trichomonas, UA Negative     Clue Cells Wet Prep HPF POC neg     Epithelial Wet Prep HPF POC 1-2     Yeast Wet Prep HPF POC neg     Bacteria Wet Prep HPF POC 1+     RBC Wet Prep HPF POC 20-25     WBC Wet Prep HPF POC 15-20     KOH Prep POC Negative       Assessment and Plan: Suprapubic pain, unspecified laterality - Plan: POCT CBC, POCT UA - Microscopic Only, POCT urinalysis dipstick, POCT urine pregnancy, Comprehensive metabolic panel, promethazine (PHENERGAN) injection 25 mg, POCT Wet Prep with KOH, GC/Chlamydia Probe Amp  Valerie Shannon is here today with lower abdominal pain for about 24 hours.  Her high wbc count is suggestive of a more significant process such as appendicitis.  Also consider PID- I did send a gonorrhea and chlamydia probe on her today.  She will proceed to the ER at [facility] for further evaluation.  Called to alert the charge nurse   Signed Abbe Amsterdam, MD  10/9: saw that she called today about her sx.   meds from ED included keflex 500 QID for 10 d Flagyl BID for 14 days Doxy BID for 14 days One gram of IM rocephin.  This will certainly cover for PID Called to check on her.  She is "doing ok," pain is getting better slowly.  Asked her to let us know if not better in the next day or so.    10/12: called to check that she is better.  She is "feeling much better" and her pain is decreased.  Her WBC count looked a lot better at her recheck over the weekend. She would like to have a TOC for gonorrhea- I will place an order for a uriprobe for her to do in a couple of weeks.

## 2014-02-01 NOTE — Patient Instructions (Signed)
Please proceed [facility] the [facility].  I will call and let the charge nurse know that you are coming.  You may have appendicitis or another abdominal problem.

## 2014-02-01 NOTE — ED Provider Notes (Signed)
CSN: [id number]     Arrival date & time 02/01/14  1240 History   First MD Initiated Contact with Patient 02/01/14 1316     Chief Complaint  Patient presents with  . Abdominal Pain     (Consider location/radiation/quality/duration/timing/severity/associated sxs/prior Treatment) The history is provided by the patient. No language interpreter was used.  Valerie Shannon is a 38 year old female with past medical history of depression, anxiety, polysubstance abuse, PVCs presenting to [facility] apartment with right lower quadrant discomfort that has now spread to the suprapubic region. Patient reported that the pain started yesterday. Stated that the pain is a dull, aching pain when resting - reported that with motion the pain is a sharp, stabbing pain. Patient reported that she has been using Ibuprofen 800 mg without relief. Stated that she had a BM this morning and stated that there was bright red blood in the stools - stated that she had intense back pain when she had her BM that has now resolved. Reported that she has been having nausea with negative episodes of emesis. Prior to arrival to the emergency department patient was seen and assessed in urgent care Center and recommended patient to come to the ED to be further assessed for possible appendicitis. Denied dysuria, melena, headache, dizziness, chest pain, shortness of breath, difficulty breathing, diarrhea, vomiting. Last menstrual period was yesterday-patient has been on Mirena for approximately 2 years. PCP none  Past Medical History  Diagnosis Date  . Premature ventricular contractions   . Obesity   . Tobacco dependence   . Depression   . Substance abuse   . Anxiety    Past Surgical History  Procedure Laterality Date  . Bilateral tubal ligation  1997  . Tubal ligation     Family History  Problem Relation Age of Onset  . Arrhythmia      father had a sudden cardiac arrest at age 40 and was rescucitated 3-4 years ago.  He has an ICD and  is followed by Dr Ladona Ridgel.  Paternal grandfather died suddenly at age 64 at home.  . Depression Mother   . Heart disease Father   . ADD / ADHD Son   . Depression Maternal Grandmother   . Cancer Maternal Grandmother   . Depression Maternal Grandfather   . Cancer Paternal Grandmother   . Heart disease Paternal Grandmother   . Heart disease Paternal Grandfather   . Heart failure Father    History  Substance Use Topics  . Smoking status: Former Games developer  . Smokeless tobacco: Not on file     Comment: chantix prescribed today, she is willing to quit  . Alcohol Use: Yes     Comment: rare   OB History   Grav Para Term Preterm Abortions TAB SAB Ect Mult Living                 Review of Systems  Constitutional: Positive for chills. Negative for fever.  Respiratory: Negative for chest tightness and shortness of breath.   Cardiovascular: Negative for chest pain.  Gastrointestinal: Positive for nausea, abdominal pain and blood in stool. Negative for vomiting, diarrhea, constipation and anal bleeding.  Genitourinary: Positive for vaginal bleeding (Currently menstruating) and pelvic pain (Right-sided). Negative for dysuria, hematuria, vaginal discharge and vaginal pain.  Musculoskeletal: Negative for back pain and neck pain.  Neurological: Negative for weakness.      Allergies  Latex  Home Medications   Prior to Admission medications   Medication Sig Start Date End Date Taking?  Authorizing Provider  Cyanocobalamin (VITAMIN B-12 CR PO) Take 1 tablet by mouth daily.    Yes Historical Provider, MD  DULoxetine (CYMBALTA) 60 MG capsule Take 1 capsule (60 mg total) by mouth daily. 05/10/13  Yes Elvina SidleKurt Lauenstein, MD  ibuprofen (ADVIL,MOTRIN) 800 MG tablet Take 800 mg by mouth every 8 (eight) hours as needed for moderate pain (abdominal pain).   Yes Historical Provider, MD  ketorolac (TORADOL) 60 MG/2ML SOLN injection Inject 60 mg into the muscle once.   Yes Historical Provider, MD  omeprazole  (PRILOSEC) 20 MG capsule Take 20 mg by mouth daily.   Yes Historical Provider, MD  valACYclovir (VALTREX) 1000 MG tablet Take 1 tablet (1,000 mg total) by mouth daily. 01/20/14  Yes Carmelina DaneJeffery S Anderson, MD  levonorgestrel (MIRENA) 20 MCG/24HR IUD 1 each by Intrauterine route once.    Historical Provider, MD   BP 125/61  Pulse 106  Temp(Src) 99.4 F (37.4 C) (Oral)  Resp 22  SpO2 99% Physical Exam  Nursing note and vitals reviewed. Constitutional: She is oriented to person, place, and time. She appears well-developed and well-nourished.  Patient appears uncomfortable  HENT:  Head: Normocephalic and atraumatic.  Mouth/Throat: Oropharynx is clear and moist. No oropharyngeal exudate.  Eyes: Conjunctivae and EOM are normal. Pupils are equal, round, and reactive to light. Right eye exhibits no discharge. Left eye exhibits no discharge.  Neck: Normal range of motion. Neck supple. No tracheal deviation present.  Negative neck stiffness Negative nuchal rigidity Negative cervical lymphadenopathy Negative meningeal signs  Cardiovascular: Regular rhythm and normal heart sounds.  Exam reveals no friction rub.   No murmur heard. Pulses:      Radial pulses are 2+ on the right side, and 2+ on the left side.       Dorsalis pedis pulses are 2+ on the right side, and 2+ on the left side.  Tachycardia upon auscultation  Pulmonary/Chest: Effort normal and breath sounds normal. No respiratory distress. She has no wheezes. She has no rales.  Patient is able to speak in full sentences without difficulty Negative use of accessory muscles Negative stridor  Abdominal: Soft. Bowel sounds are normal. She exhibits no distension. There is tenderness in the right lower quadrant. There is rebound. There is no guarding.  Obese Negative abdominal distention Bowel sounds normoactive in all 4 quadrants Abdomen soft upon palpation Discomfort upon palpation to the right lower quadrant-exquisite tenderness Positive  Rovsing's  Genitourinary:  Bimanual: Negative swelling, erythema, inflammation, lesions, sores deformities noted to the external genitalia. Blood noted on the external region - patient currently menstruating. Cervix palpated. Negative CMT. Exquisite right adnexal tenderness. Negative left adnexal tenderness. Blood on glove noted.  Rectal exam: Negative swelling, erythema, inflammation,lesions, sores, hemorrhoids noted. Bright red blood per rectum noted. Strong sphincter. Bright red blood on glove.  Exam chaperoned with tech  Musculoskeletal: Normal range of motion.  Full ROM to upper and lower extremities without difficulty noted, negative ataxia noted.  Lymphadenopathy:    She has no cervical adenopathy.  Neurological: She is alert and oriented to person, place, and time. No cranial nerve deficit. She exhibits normal muscle tone. Coordination normal.  Cranial nerves III-XII grossly intact Strength 5+/5+ to upper and lower extremities bilaterally with resistance applied, equal distribution noted Patient is able to respond to questions appropriately Patient follows commands well  Skin: Skin is warm and dry. No rash noted. No erythema.  Psychiatric: She has a normal mood and affect. Her behavior is normal. Thought content normal.  ED Course  Procedures (including critical care time)  Results for orders placed during the hospital encounter of 02/01/14  CBC      Result Value Ref Range   WBC 19.0 (*) 4.0 - 10.5 K/uL   RBC 4.42  3.87 - 5.11 MIL/uL   Hemoglobin 12.1  12.0 - 15.0 g/dL   HCT 40.9  81.1 - 91.4 %   MCV 87.3  78.0 - 100.0 fL   MCH 27.4  26.0 - 34.0 pg   MCHC 31.3  30.0 - 36.0 g/dL   RDW 78.2 (*) 95.6 - 21.3 %   Platelets 176  150 - 400 K/uL  BASIC METABOLIC PANEL      Result Value Ref Range   Sodium 139  137 - 147 mEq/L   Potassium 3.7  3.7 - 5.3 mEq/L   Chloride 103  96 - 112 mEq/L   CO2 24  19 - 32 mEq/L   Glucose, Bld 89  70 - 99 mg/dL   BUN 11  6 - 23 mg/dL    Creatinine, Ser 0.86  0.50 - 1.10 mg/dL   Calcium 8.7  8.4 - 57.8 mg/dL   GFR calc non Af Amer >90  >90 mL/min   GFR calc Af Amer >90  >90 mL/min   Anion gap 12  5 - 15  URINALYSIS, ROUTINE W REFLEX MICROSCOPIC      Result Value Ref Range   Color, Urine AMBER (*) YELLOW   APPearance TURBID (*) CLEAR   Specific Gravity, Urine 1.021  1.005 - 1.030   pH 5.0  5.0 - 8.0   Glucose, UA NEGATIVE  NEGATIVE mg/dL   Hgb urine dipstick LARGE (*) NEGATIVE   Bilirubin Urine NEGATIVE  NEGATIVE   Ketones, ur NEGATIVE  NEGATIVE mg/dL   Protein, ur 469 (*) NEGATIVE mg/dL   Urobilinogen, UA 0.2  0.0 - 1.0 mg/dL   Nitrite NEGATIVE  NEGATIVE   Leukocytes, UA LARGE (*) NEGATIVE  LIPASE, BLOOD      Result Value Ref Range   Lipase 19  11 - 59 U/L  URINE MICROSCOPIC-ADD ON      Result Value Ref Range   Squamous Epithelial / LPF FEW (*) RARE   WBC, UA 21-50  <3 WBC/hpf   RBC / HPF 21-50  <3 RBC/hpf   Bacteria, UA RARE  RARE   Casts HYALINE CASTS (*) NEGATIVE  POC URINE PREG, ED      Result Value Ref Range   Preg Test, Ur NEGATIVE  NEGATIVE  POC OCCULT BLOOD, ED      Result Value Ref Range   Fecal Occult Bld POSITIVE (*) NEGATIVE  I-STAT CHEM 8, ED      Result Value Ref Range   Sodium 138  137 - 147 mEq/L   Potassium 3.4 (*) 3.7 - 5.3 mEq/L   Chloride 102  96 - 112 mEq/L   BUN 9  6 - 23 mg/dL   Creatinine, Ser 6.29  0.50 - 1.10 mg/dL   Glucose, Bld 87  70 - 99 mg/dL   Calcium, Ion 5.28 (*) 1.12 - 1.23 mmol/L   TCO2 21  0 - 100 mmol/L   Hemoglobin 14.3  12.0 - 15.0 g/dL   HCT 41.3  24.4 - 01.0 %    Labs Review Labs Reviewed  CBC - Abnormal; Notable for the following:    WBC 19.0 (*)    RDW 17.0 (*)    All other components within normal limits  URINALYSIS,  ROUTINE W REFLEX MICROSCOPIC - Abnormal; Notable for the following:    Color, Urine AMBER (*)    APPearance TURBID (*)    Hgb urine dipstick LARGE (*)    Protein, ur 100 (*)    Leukocytes, UA LARGE (*)    All other components within  normal limits  URINE MICROSCOPIC-ADD ON - Abnormal; Notable for the following:    Squamous Epithelial / LPF FEW (*)    Casts HYALINE CASTS (*)    All other components within normal limits  POC OCCULT BLOOD, ED - Abnormal; Notable for the following:    Fecal Occult Bld POSITIVE (*)    All other components within normal limits  I-STAT CHEM 8, ED - Abnormal; Notable for the following:    Potassium 3.4 (*)    Calcium, Ion 1.08 (*)    All other components within normal limits  BASIC METABOLIC PANEL  LIPASE, BLOOD  OCCULT BLOOD X 1 CARD TO LAB, STOOL  POC URINE PREG, ED    Imaging Review Ct Abdomen Pelvis W Contrast  02/01/2014   CLINICAL DATA:  Severe abdominal and suprapubic pain. Diarrhea. Elevated white blood cell count. Symptoms for 1 day.  EXAM: CT ABDOMEN AND PELVIS WITH CONTRAST  TECHNIQUE: Multidetector CT imaging of the abdomen and pelvis was performed using the standard protocol following bolus administration of intravenous contrast.  CONTRAST:  100 mL OMNIPAQUE IOHEXOL 300 MG/ML SOLN, 50 mL OMNIPAQUE IOHEXOL 300 MG/ML SOLN  COMPARISON:  None.  FINDINGS: The lung bases are clear.  No pleural or pericardial effusion.  The gallbladder, liver, spleen, adrenal glands, pancreas, kidneys and biliary tree all appear normal. The appendix is well seen and normal in appearance. A few colonic diverticula are identified, most notable in the sigmoid, but there is no evidence of diverticulitis. The stomach and small bowel appear normal. A small amount of free pelvic fluid is compatible with physiologic change. IUD is in place in the uterus. 4.3 cm right ovarian cyst is incidentally noted and likely a functional cyst. No lymphadenopathy is identified. No focal bony abnormality is seen. Tiny fat containing umbilical hernia is noted.  IMPRESSION: No acute finding in the abdomen or pelvis. Negative for appendicitis.  Small right hip joint effusion may be incidental. Recommend clinical correlation for right  hip pain.   Electronically Signed   By: Drusilla Kanner M.D.   On: 02/01/2014 16:10     EKG Interpretation None      MDM   Final diagnoses:  Pelvic pain in female    Medications  sodium chloride 0.9 % bolus 1,000 mL (1,000 mLs Intravenous New Bag/Given 02/01/14 1447)  iohexol (OMNIPAQUE) 300 MG/ML solution 50 mL (50 mLs Oral Contrast Given 02/01/14 1553)  morphine 4 MG/ML injection 4 mg (4 mg Intravenous Given 02/01/14 1518)  iohexol (OMNIPAQUE) 300 MG/ML solution 100 mL (100 mLs Intravenous Contrast Given 02/01/14 1553)   Filed Vitals:   02/01/14 1245 02/01/14 1447  BP: 128/63 125/61  Pulse: 70 106  Temp: 99.4 F (37.4 C)   TempSrc: Oral   Resp: 20 22  SpO2: 100% 99%   This provider reviewed patient's chart. Patient was seen and assessed at [facility] urgent care Center prior to arrival to the emergency department. CBC noted elevated white blood cell count of 20.6. Wet prep performed in urgent care Center identified-negative for Trichomonas, yeast, clue cells. 15-20 white blood cells noted on wet prep. CBC noted elevated white blood cell count of 19.0. BMP unremarkable. Lipase negative  elevation. Urine pregnancy negative. Urinalysis noted large leukocytes with white blood cell count 21-50. Fecal occult positive - hemoglobin 12.1, hematocrit 38.6-negative drop. Urinalysis noted large hemoglobin-patient currently menstruating with large leukocytes with white blood cell count 21-50. Urine pregnancy negative. CT abdomen and pelvis no acute findings of the abdomen-negative for appendicitis. Small right hip joint effusion may be incidental. Possible hydrosalpinx, cannot rule out possible PID secondary to elevated WBC on wet prep and urinalysis. Urine culture pending. Ultrasound ordered and pending to rule out possible torsion versus hydrosalpinx or TOA. Discussed case with Claudette Head, PA-C. Transfer of care to [facility], PA-C.   Raymon Mutton, PA-C 02/01/14 1718

## 2014-02-01 NOTE — ED Notes (Signed)
ED phlebotomy notified we were unable to collect blood with IV start.

## 2014-02-01 NOTE — Discharge Instructions (Signed)
Today your ultrasound showed a hypoechoic mass on your left ovary. Please follow up with your GYN about this for repeat US in 6-12 weeks, preferably one week after your period. Return to ED for new or worsening symptoms.   Pelvic Pain Female pelvic pain can be caused by many different things and start from a variety of places. Pelvic pain refers to pain that is located in the lower half of the abdomen and between your hips. The pain may occur over a short period of time (acute) or may be reoccurring (chronic). The cause of pelvic pain may be related to disorders affecting the female reproductive organs (gynecologic), but it may also be related to the bladder, kidney stones, an intestinal complication, or muscle or skeletal problems. Getting help right away for pelvic pain is important, especially if there has been severe, sharp, or a sudden onset of unusual pain. It is also important to get help right away because some types of pelvic pain can be life threatening.  CAUSES  Below are only some of the causes of pelvic pain. The causes of pelvic pain can be in one of several categories.   Gynecologic.  Pelvic inflammatory disease.  Sexually transmitted infection.  Ovarian cyst or a twisted ovarian ligament (ovarian torsion).  Uterine lining that grows outside the uterus (endometriosis).  Fibroids, cysts, or tumors.  Ovulation.  Pregnancy.  Pregnancy that occurs outside the uterus (ectopic pregnancy).  Miscarriage.  Labor.  Abruption of the placenta or ruptured uterus.  Infection.  Uterine infection (endometritis).  Bladder infection.  Diverticulitis.  Miscarriage related to a uterine infection (septic abortion).  Bladder.  Inflammation of the bladder (cystitis).  Kidney stone(s).  Gastrointestinal.  Constipation.  Diverticulitis.  Neurologic.  Trauma.  Feeling pelvic pain because of mental or emotional causes (psychosomatic).  Cancers of the bowel or  pelvis. EVALUATION  Your caregiver will want to take a careful history of your concerns. This includes recent changes in your health, a careful gynecologic history of your periods (menses), and a sexual history. Obtaining your family history and medical history is also important. Your caregiver may suggest a pelvic exam. A pelvic exam will help identify the location and severity of the pain. It also helps in the evaluation of which organ system may be involved. In order to identify the cause of the pelvic pain and be properly treated, your caregiver may order tests. These tests may include:   A pregnancy test.  Pelvic ultrasonography.  An X-ray exam of the abdomen.  A urinalysis or evaluation of vaginal discharge.  Blood tests. HOME CARE INSTRUCTIONS   Only take over-the-counter or prescription medicines for pain, discomfort, or fever as directed by your caregiver.   Rest as directed by your caregiver.   Eat a balanced diet.   Drink enough fluids to make your urine clear or pale yellow, or as directed.   Avoid sexual intercourse if it causes pain.   Apply warm or cold compresses to the lower abdomen depending on which one helps the pain.   Avoid stressful situations.   Keep a journal of your pelvic pain. Write down when it started, where the pain is located, and if there are things that seem to be associated with the pain, such as food or your menstrual cycle.  Follow up with your caregiver as directed.  SEEK MEDICAL CARE IF:  Your medicine does not help your pain.  You have abnormal vaginal discharge. SEEK IMMEDIATE MEDICAL CARE IF:   You have  heavy bleeding from the vagina.   Your pelvic pain increases.   You feel light-headed or faint.   You have chills.   You have pain with urination or blood in your urine.   You have uncontrolled diarrhea or vomiting.   You have a fever or persistent symptoms for more than 3 days.  You have a fever and your  symptoms suddenly get worse.   You are being physically or sexually abused.  MAKE SURE YOU:  Understand these instructions.  Will watch your condition.  Will get help if you are not doing well or get worse. Document Released: 03/11/2004 Document Revised: 08/29/2013 Document Reviewed: 08/04/2011 [facility] Patient Information 2015 [place], [place]. This information is not intended to replace advice given to you by your health care provider. Make sure you discuss any questions you have with your health care provider.  Urinary Tract Infection Urinary tract infections (UTIs) can develop anywhere along your urinary tract. Your urinary tract is your body's drainage system for removing wastes and extra water. Your urinary tract includes two kidneys, two ureters, a bladder, and a urethra. Your kidneys are a pair of bean-shaped organs. Each kidney is about the size of your fist. They are located below your ribs, one on each side of your spine. CAUSES Infections are caused by microbes, which are microscopic organisms, including fungi, viruses, and bacteria. These organisms are so small that they can only be seen through a microscope. Bacteria are the microbes that most commonly cause UTIs. SYMPTOMS  Symptoms of UTIs may vary by age and gender of the patient and by the location of the infection. Symptoms in young women typically include a frequent and intense urge to urinate and a painful, burning feeling in the bladder or urethra during urination. Older women and men are more likely to be tired, shaky, and weak and have muscle aches and abdominal pain. A fever may mean the infection is in your kidneys. Other symptoms of a kidney infection include pain in your back or sides below the ribs, nausea, and vomiting. DIAGNOSIS To diagnose a UTI, your caregiver will ask you about your symptoms. Your caregiver also will ask to provide a urine sample. The urine sample will be tested for bacteria and white blood  cells. White blood cells are made by your body to help fight infection. TREATMENT  Typically, UTIs can be treated with medication. Because most UTIs are caused by a bacterial infection, they usually can be treated with the use of antibiotics. The choice of antibiotic and length of treatment depend on your symptoms and the type of bacteria causing your infection. HOME CARE INSTRUCTIONS  If you were prescribed antibiotics, take them exactly as your caregiver instructs you. Finish the medication even if you feel better after you have only taken some of the medication.  Drink enough water and fluids to keep your urine clear or pale yellow.  Avoid caffeine, tea, and carbonated beverages. They tend to irritate your bladder.  Empty your bladder often. Avoid holding urine for long periods of time.  Empty your bladder before and after sexual intercourse.  After a bowel movement, women should cleanse from front to back. Use each tissue only once. SEEK MEDICAL CARE IF:   You have back pain.  You develop a fever.  Your symptoms do not begin to resolve within 3 days. SEEK IMMEDIATE MEDICAL CARE IF:   You have severe back pain or lower abdominal pain.  You develop chills.  You have nausea or  vomiting.  You have continued burning or discomfort with urination. MAKE SURE YOU:   Understand these instructions.  Will watch your condition.  Will get help right away if you are not doing well or get worse. Document Released: 01/22/2005 Document Revised: 10/14/2011 Document Reviewed: 05/23/2011 [facility] Patient Information 2015 [place], [place]. This information is not intended to replace advice given to you by your health care provider. Make sure you discuss any questions you have with your health care provider.

## 2014-02-01 NOTE — ED Notes (Signed)
Pt reports suprapubic abd pain since yesterday am. Valerie FlesherWent to work, given toradol shot by employer, helped until last night. Urgent care this am, pelvic exam, specimens sent. WBC 20k. Sent here for r/o appy. Sts intermittent nausea associated with severe pain. Denies vomiting. Denies known fever. Very loose painful BM this am.

## 2014-02-02 LAB — GC/CHLAMYDIA PROBE AMP
CT Probe RNA: NEGATIVE
GC PROBE AMP APTIMA: POSITIVE — AB

## 2014-02-02 NOTE — ED Provider Notes (Signed)
Medical screening examination/treatment/procedure(s) were conducted as a shared visit with non-physician practitioner(s) and myself.  I personally evaluated the patient during the encounter.   EKG Interpretation None     Patient with abdominal pain. White count elevated. Right lower quadrant tenderness. CT scan done. Ultrasound pending  Juliet Rudeathan R. Rubin PayorPickering, MD 02/02/14 [phone]

## 2014-02-04 ENCOUNTER — Ambulatory Visit (INDEPENDENT_AMBULATORY_CARE_PROVIDER_SITE_OTHER): Payer: 59 | Admitting: Physician Assistant

## 2014-02-04 VITALS — BP 126/74 | HR 59 | Temp 98.3°F | Resp 18 | Ht 65.0 in | Wt 251.0 lb

## 2014-02-04 DIAGNOSIS — N739 Female pelvic inflammatory disease, unspecified: Secondary | ICD-10-CM

## 2014-02-04 DIAGNOSIS — R102 Pelvic and perineal pain: Secondary | ICD-10-CM

## 2014-02-04 LAB — POCT CBC
Granulocyte percent: 74.8 %G (ref 37–80)
HCT, POC: 34.3 % — AB (ref 37.7–47.9)
HEMOGLOBIN: 10.7 g/dL — AB (ref 12.2–16.2)
LYMPH, POC: 1.7 (ref 0.6–3.4)
MCH: 26.9 pg — AB (ref 27–31.2)
MCHC: 31.2 g/dL — AB (ref 31.8–35.4)
MCV: 86 fL (ref 80–97)
MID (cbc): 0.5 (ref 0–0.9)
MPV: 7.7 fL (ref 0–99.8)
PLATELET COUNT, POC: 208 10*3/uL (ref 142–424)
POC Granulocyte: 6.7 (ref 2–6.9)
POC LYMPH PERCENT: 19.2 %L (ref 10–50)
POC MID %: 6 %M (ref 0–12)
RBC: 3.98 M/uL — AB (ref 4.04–5.48)
RDW, POC: 18.8 %
WBC: 8.9 10*3/uL (ref 4.6–10.2)

## 2014-02-04 MED ORDER — HYDROCODONE-ACETAMINOPHEN 5-325 MG PO TABS: 1.0000 | ORAL_TABLET | Freq: Four times a day (QID) | ORAL | Status: DC | PRN

## 2014-02-04 NOTE — Progress Notes (Signed)
Subjective:    Patient ID: Valerie Shannon, female    DOB: 03/17/1976, 38 y.o.   MRN: [id number]  HPI  This is a 38 year old female who was seen here on 02/01/14 with severe pelvic pain and a white count of 20.6. She was sent to the ED and was diagnosed with PID and discharged on rocephin, doxy, keflex and flagyl. She has been taking norco prn for pain. She reports her current pain is 6/10 without pain meds and 2/10 on pain meds. She reports some nausea with taking the antibiotics - zofran helps. She is drinking a lot of water. Her appetite is decreased. She has continued to have some vaginal discharge - described as mucous-like and blood-tinged. She reports she still feels bloated but her abdomen is not as distended as prior. She denies fever, chills, vomiting, diarrhea, constipation, or dysuria.  Review of Systems  Constitutional: Positive for appetite change. Negative for fever and chills.  Gastrointestinal: Positive for nausea, abdominal pain and abdominal distention. Negative for vomiting, diarrhea and constipation.  Genitourinary: Positive for vaginal discharge and pelvic pain. Negative for dysuria.   CT abdomen pelvic on 02/01/14: No acute finding in the abdomen or pelvis. Negative for  appendicitis. Small right hip joint effusion may be incidental. Recommend clinical  correlation for right hip pain  U/S Pelvic on 02/01/14: 3.4 x 2.7 x 3.1 cm simple cyst right ovary. Hypoechoic mass left ovary of uncertain etiology measuring 2.2 x 1.3 x 1.2 cm. Short-interval follow up ultrasound in 6-12 weeks is recommended,  preferably during the week following the patient's normal menses. Uterus retroverted but otherwise normal in appearance. Intrauterine device within the endometrium. Endometrium is normal in thickness. No demonstrable ovarian torsion.     Objective:   Physical Exam  Vitals reviewed. Constitutional: She is oriented to person, place, and time. She appears well-developed and  well-nourished. No distress.  HENT:  Head: Normocephalic and atraumatic.  Eyes: Conjunctivae are normal. Right eye exhibits no discharge. Left eye exhibits no discharge. No scleral icterus.  Cardiovascular: Normal rate.  An irregular rhythm present.  Patient self-reports she has frequent PVCs  Pulmonary/Chest: Effort normal and breath sounds normal. No respiratory distress.  Abdominal: Soft. Bowel sounds are normal. She exhibits no mass. There is tenderness in the right lower quadrant. There is guarding. There is no rigidity, no rebound and no CVA tenderness.    Lymphadenopathy:    She has no cervical adenopathy.  Neurological: She is alert and oriented to person, place, and time.  Skin: Skin is warm, dry and intact. She is not diaphoretic.  Psychiatric: She has a normal mood and affect. Her speech is normal and behavior is normal. Thought content normal.    Results for orders placed in visit on 02/04/14  POCT CBC      Result Value Ref Range   WBC 8.9  4.6 - 10.2 K/uL   Lymph, poc 1.7  0.6 - 3.4   POC LYMPH PERCENT 19.2  10 - 50 %L   MID (cbc) 0.5  0 - 0.9   POC MID % 6.0  0 - 12 %M   POC Granulocyte 6.7  2 - 6.9   Granulocyte percent 74.8  37 - 80 %G   RBC 3.98 (*) 4.04 - 5.48 M/uL   Hemoglobin 10.7 (*) 12.2 - 16.2 g/dL   HCT, POC 86.534.3 (*) 78.437.7 - 47.9 %   MCV 86.0  80 - 97 fL   MCH, POC 26.9 (*) 27 -  31.2 pg   MCHC 31.2 (*) 31.8 - 35.4 g/dL   RDW, POC 16.118.8     Platelet Count, POC 208  142 - 424 K/uL   MPV 7.7  0 - 99.8 fL       Assessment & Plan:  1. Female pelvic inflammatory disease 2. Pelvic pain in female  Patient presents after a hospital admission for PID 4 days ago. CT and pelvic U/S negative except for ovarian cysts. She continues to take her antibiotics - keflex, flagyl, and doxycycline. She had an injection of rocephin in the hospital. She continues to have pain, but improved. She continues to have some vaginal discharge. She is not having any systemic symptoms.  Her wbc is much improved from 4 days ago - 8.9 down from 20.6. She was prescribed Norco #15 pills for continued pain and reassured her pain should improve.   - POCT CBC - HYDROcodone-acetaminophen (NORCO/VICODIN) 5-325 MG per tablet; Take 1-2 tablets by mouth every 6 (six) hours as needed for moderate pain or severe pain.  Dispense: 15 tablet; Refill: 0   Roswell MinersNicole V. Dyke BrackettBush, PA-C, MHS [facility]  02/05/2014

## 2014-02-04 NOTE — Patient Instructions (Signed)
Continue to drink plenty of fluids and get lots of rest. Please call with any concerns or if symptoms do not improve.  Pelvic Inflammatory Disease Pelvic inflammatory disease (PID) refers to an infection in some or all of the female organs. The infection can be in the uterus, ovaries, fallopian tubes, or the surrounding tissues in the pelvis. PID can cause abdominal or pelvic pain that comes on suddenly (acute pelvic pain). PID is a serious infection because it can lead to lasting (chronic) pelvic pain or the inability to have children (infertile).  CAUSES  The infection is often caused by the normal bacteria found in the vaginal tissues. PID may also be caused by an infection that is spread during sexual contact. PID can also occur following:   The birth of a baby.   A miscarriage.   An abortion.   Major pelvic surgery.   The use of an intrauterine device (IUD).   A sexual assault.  RISK FACTORS Certain factors can put a person at higher risk for PID, such as:  Being younger than 25 years.  Being sexually active at [place] age.  Usingnonbarrier contraception.  Havingmultiple sexual partners.  Having sex with someone who has symptoms of a genital infection.  Using oral contraception. Other times, certain behaviors can increase the possibility of getting PID, such as:  Having sex during your period.  Using a vaginal douche.  Having an intrauterine device (IUD) in place. SYMPTOMS   Abdominal or pelvic pain.   Fever.   Chills.   Abnormal vaginal discharge.  Abnormal uterine bleeding.   Unusual pain shortly after finishing your period. DIAGNOSIS  Your caregiver will choose some of the following methods to make a diagnosis, such as:   Performinga physical exam and history. A pelvic exam typically reveals a very tender uterus and surrounding pelvis.   Ordering laboratory tests including a pregnancy test, blood tests, and urine  test.  Orderingcultures of the vagina and cervix to check for a sexually transmitted infection (STI).  Performing an ultrasound.   Performing a laparoscopic procedure to look inside the pelvis.  TREATMENT   Antibiotic medicines may be prescribed and taken by mouth.   Sexual partners may be treated when the infection is caused by a sexually transmitted disease (STD).   Hospitalization may be needed to give antibiotics intravenously.  Surgery may be needed, but this is rare. It may take weeks until you are completely well. If you are diagnosed with PID, you should also be checked for human immunodeficiency virus (HIV). HOME CARE INSTRUCTIONS   If given, take your antibiotics as directed. Finish the medicine even if you start to feel better.   Only take over-the-counter or prescription medicines for pain, discomfort, or fever as directed by your caregiver.   Do not have sexual intercourse until treatment is completed or as directed by your caregiver. If PID is confirmed, your recent sexual partner(s) will need treatment.   Keep your follow-up appointments. SEEK MEDICAL CARE IF:   You have increased or abnormal vaginal discharge.   You need prescription medicine for your pain.   You vomit.   You cannot take your medicines.   Your partner has an STD.  SEEK IMMEDIATE MEDICAL CARE IF:   You have a fever.   You have increased abdominal or pelvic pain.   You have chills.   You have pain when you urinate.   You are not better after 72 hours following treatment.  MAKE SURE YOU:  Understand these instructions.  Will watch your condition.  Will get help right away if you are not doing well or get worse. Document Released: 04/14/2005 Document Revised: 08/09/2012 Document Reviewed: 04/10/2011 [facility] Patient Information 2015 [place]. This information is not intended to replace advice given to you by your health care provider. Make sure you  discuss any questions you have with your health care provider.

## 2014-02-05 NOTE — Progress Notes (Signed)
I was directly involved with the patient's care and agree with the physical, diagnosis and treatment plan.  

## 2014-02-06 NOTE — Addendum Note (Signed)
Addended by: Abbe AmsterdamOPLAND, Olivia Pavelko C on: 02/06/2014 07:29 PM   Modules accepted: Orders

## 2014-02-07 ENCOUNTER — Telehealth: Payer: Self-pay

## 2014-02-07 NOTE — Telephone Encounter (Signed)
[name] - Pt wants to ask some questions about her lab results.  Please call [phone]

## 2014-02-08 NOTE — Telephone Encounter (Signed)
lmom to cb. 

## 2014-02-09 NOTE — Telephone Encounter (Signed)
lmom for pt to cb

## 2014-02-10 ENCOUNTER — Telehealth: Payer: Self-pay | Admitting: *Deleted

## 2014-02-10 ENCOUNTER — Other Ambulatory Visit: Payer: Self-pay

## 2014-02-10 DIAGNOSIS — N730 Acute parametritis and pelvic cellulitis: Secondary | ICD-10-CM

## 2014-02-10 DIAGNOSIS — Z113 Encounter for screening for infections with a predominantly sexual mode of transmission: Secondary | ICD-10-CM

## 2014-02-10 NOTE — Telephone Encounter (Signed)
[facility] called to get an update on pt- Per new CDC guidelines pt must be treated with Azithromycin 1 gm along with Rochepin injection. They do not approve of the treatment plan thus far that pt has received. [facility] Dept is going to contact pt to RTC for 1 gm Azithromycin injection.

## 2014-02-13 ENCOUNTER — Telehealth: Payer: Self-pay

## 2014-02-13 MED ORDER — AZITHROMYCIN 250 MG PO TABS: ORAL_TABLET | ORAL | Status: DC

## 2014-02-13 NOTE — Telephone Encounter (Signed)
Called and discussed with her.  The HD really wants her to be treated with azithromycin for possible chlamydia.  Also, it seems that she may have been exposed to syphilis.  Ordered BW for her and she will come in for additional testing.  Ordered azithromycin 1gm- she will wait until she finishes the rest of her other abx in a couple of days and then take this.   She states she can actually get azithromycin from her job's stock bottle so she may not have to fill the rx, but I sent for her anyway.    Please inform the HD

## 2014-02-13 NOTE — Telephone Encounter (Signed)
See other message

## 2014-02-13 NOTE — Telephone Encounter (Signed)
Pt would like Dr. Patsy Lageropland to give her a call. She says she has a question that only Dr. Patsy Lageropland can answer.

## 2014-02-13 NOTE — Telephone Encounter (Signed)
Discussed with copland she will call pt

## 2014-02-14 ENCOUNTER — Other Ambulatory Visit (INDEPENDENT_AMBULATORY_CARE_PROVIDER_SITE_OTHER): Payer: 59

## 2014-02-14 DIAGNOSIS — Z113 Encounter for screening for infections with a predominantly sexual mode of transmission: Secondary | ICD-10-CM

## 2014-02-14 DIAGNOSIS — N730 Acute parametritis and pelvic cellulitis: Secondary | ICD-10-CM

## 2014-02-15 LAB — GC/CHLAMYDIA PROBE AMP
CT Probe RNA: NEGATIVE
GC Probe RNA: NEGATIVE

## 2014-02-15 LAB — HIV ANTIBODY (ROUTINE TESTING W REFLEX): HIV 1&2 Ab, 4th Generation: NONREACTIVE

## 2014-02-15 LAB — HEPATITIS B SURFACE ANTIBODY, QUANTITATIVE: HEPATITIS B-POST: 0 m[IU]/mL

## 2014-02-15 LAB — HEPATITIS C ANTIBODY: HCV AB: NEGATIVE

## 2014-02-15 LAB — HEPATITIS B SURFACE ANTIGEN: Hepatitis B Surface Ag: NEGATIVE

## 2014-02-15 LAB — RPR

## 2014-02-16 ENCOUNTER — Telehealth: Payer: Self-pay | Admitting: *Deleted

## 2014-02-16 NOTE — Telephone Encounter (Signed)
Pt does not use her my chart. Advised pt of lab results

## 2014-03-20 ENCOUNTER — Ambulatory Visit (INDEPENDENT_AMBULATORY_CARE_PROVIDER_SITE_OTHER): Payer: 59 | Admitting: Family Medicine

## 2014-03-20 VITALS — BP 140/90 | HR 93 | Temp 98.2°F | Resp 18 | Ht 65.0 in | Wt 244.6 lb

## 2014-03-20 DIAGNOSIS — F32A Depression, unspecified: Secondary | ICD-10-CM

## 2014-03-20 DIAGNOSIS — F410 Panic disorder [episodic paroxysmal anxiety] without agoraphobia: Secondary | ICD-10-CM

## 2014-03-20 DIAGNOSIS — F329 Major depressive disorder, single episode, unspecified: Secondary | ICD-10-CM

## 2014-03-20 MED ORDER — ALPRAZOLAM 0.25 MG PO TABS
0.2500 mg | ORAL_TABLET | Freq: Once | ORAL | Status: AC
  Administered 2014-03-20: 0.25 mg via ORAL

## 2014-03-20 MED ORDER — CLONAZEPAM 0.5 MG PO TABS: ORAL_TABLET | ORAL | Status: DC

## 2014-03-20 NOTE — Patient Instructions (Signed)
Generalized Anxiety Disorder Generalized anxiety disorder (GAD) is a mental disorder. It interferes with life functions, including relationships, work, and school. GAD is different from normal anxiety, which everyone experiences at some point in their lives in response to specific life events and activities. Normal anxiety actually helps us prepare for and get through these life events and activities. Normal anxiety goes away after the event or activity is over.  GAD causes anxiety that is not necessarily related to specific events or activities. It also causes excess anxiety in proportion to specific events or activities. The anxiety associated with GAD is also difficult to control. GAD can vary from mild to severe. People with severe GAD can have intense waves of anxiety with physical symptoms (panic attacks).  SYMPTOMS The anxiety and worry associated with GAD are difficult to control. This anxiety and worry are related to many life events and activities and also occur more days than not for 6 months or longer. People with GAD also have three or more of the following symptoms (one or more in children):  Restlessness.   Fatigue.  Difficulty concentrating.   Irritability.  Muscle tension.  Difficulty sleeping or unsatisfying sleep. DIAGNOSIS GAD is diagnosed through an assessment by your health care provider. Your health care provider will ask you questions aboutyour mood,physical symptoms, and events in your life. Your health care provider may ask you about your medical history and use of alcohol or drugs, including prescription medicines. Your health care provider may also do a physical exam and blood tests. Certain medical conditions and the use of certain substances can cause symptoms similar to those associated with GAD. Your health care provider may refer you to a mental health specialist for further evaluation. TREATMENT The following therapies are usually used to treat GAD:    Medication. Antidepressant medication usually is prescribed for long-term daily control. Antianxiety medicines may be added in severe cases, especially when panic attacks occur.   Talk therapy (psychotherapy). Certain types of talk therapy can be helpful in treating GAD by providing support, education, and guidance. A form of talk therapy called cognitive behavioral therapy can teach you healthy ways to think about and react to daily life events and activities.  Stress managementtechniques. These include yoga, meditation, and exercise and can be very helpful when they are practiced regularly. A mental health specialist can help determine which treatment is best for you. Some people see improvement with one therapy. However, other people require a combination of therapies. Document Released: 08/09/2012 Document Revised: 08/29/2013 Document Reviewed: 08/09/2012 ExitCare Patient Information 2015 [employer], [employer]. This information is not intended to replace advice given to you by your health care provider. Make sure you discuss any questions you have with your health care provider.  
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
## 2014-03-21 ENCOUNTER — Encounter: Payer: Self-pay | Admitting: Family Medicine

## 2014-03-27 ENCOUNTER — Other Ambulatory Visit: Payer: Self-pay | Admitting: Family Medicine

## 2014-05-23 ENCOUNTER — Telehealth: Payer: Self-pay

## 2014-05-23 NOTE — Telephone Encounter (Signed)
Pt has refills available at her pharmacy. Lm to advise pt.

## 2014-05-23 NOTE — Telephone Encounter (Signed)
DULoxetine (CYMBALTA) 60 MG capsule   [id number] [phone]

## 2014-07-26 ENCOUNTER — Other Ambulatory Visit: Payer: Self-pay | Admitting: Emergency Medicine

## 2014-12-23 ENCOUNTER — Ambulatory Visit (INDEPENDENT_AMBULATORY_CARE_PROVIDER_SITE_OTHER): Payer: 59 | Admitting: Family Medicine

## 2014-12-23 VITALS — BP 152/110 | HR 118 | Temp 98.7°F | Resp 17 | Ht 65.5 in | Wt 285.0 lb

## 2014-12-23 DIAGNOSIS — J209 Acute bronchitis, unspecified: Secondary | ICD-10-CM

## 2014-12-23 MED ORDER — AZITHROMYCIN 250 MG PO TABS: ORAL_TABLET | ORAL | Status: DC

## 2014-12-23 MED ORDER — PREDNISONE 20 MG PO TABS: ORAL_TABLET | ORAL | Status: DC

## 2014-12-23 MED ORDER — ALBUTEROL SULFATE HFA 108 (90 BASE) MCG/ACT IN AERS: 2.0000 | INHALATION_SPRAY | RESPIRATORY_TRACT | Status: DC | PRN

## 2014-12-23 MED ORDER — HYDROCODONE-ACETAMINOPHEN 7.5-325 MG/15ML PO SOLN: 10.0000 mL | Freq: Four times a day (QID) | ORAL | Status: DC | PRN

## 2014-12-23 NOTE — Progress Notes (Signed)
@[facility] @  This chart was scribed for Elvina Sidle, MD by Andrew Au, ED Scribe. This patient was seen in room 2 and the patient's care was started at 3:04 PM.  Patient ID: Valerie Shannon MRN: [id number], DOB: 05-20-1975, 39 y.o. Date of Encounter: 12/23/2014, 2:59 PM  Primary Physician: No PCP Per Patient  Chief Complaint: * Chief Complaint  Patient presents with  . Laryngitis  . Sore Throat  . Cough  . URI    HPI: 39 y.o. year old female with history below presents with cough and hoarseness for 1 week. Pt reports symptoms started with her sinuses. She reports mucous in her chest but is unable to bring it up with cough. Pt had hx of pneumonia.  She uses an inhaler as need for chronic bronchitis but states inhaler has ran out. Pt states her husband was initially diagnosed with a URI. She denies fever and emesis. Pt is a smoker.   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   Past Medical History  Diagnosis Date  . Premature ventricular contractions   . Obesity   . Tobacco dependence   . Depression   . Anxiety   . Substance abuse     cocaine, in remission, > 10 years ago     Home Meds: Prior to Admission medications   Medication Sig Start Date End Date Taking? Authorizing Provider  DULoxetine (CYMBALTA) 60 MG capsule Take 1 capsule by mouth  daily 03/28/14  Yes Thao P Le, DO  levonorgestrel (MIRENA) 20 MCG/24HR IUD 1 each by Intrauterine route once.   Yes Historical Provider, MD  omeprazole (PRILOSEC) 20 MG capsule Take 20 mg by mouth daily.   Yes Historical Provider, MD  valACYclovir (VALTREX) 1000 MG tablet TAKE 1 TABLET BY MOUTH EVERY DAY 07/27/14  Yes Carmelina Dane, MD  clonazePAM (KLONOPIN) 0.5 MG tablet Take 1/2-1 tab PO BID prn Patient not taking: Reported on 12/23/2014 03/20/14   Thao P Le, DO    Allergies:  Allergies  Allergen Reactions  . Latex     Rash if wears for a long period of time    Social History   Social History  .  Marital Status: Single    Spouse Name: N/A  . Number of Children: N/A  . Years of Education: N/A   Occupational History  . Not on file.   Social History Main Topics  . Smoking status: Current Every Day Smoker -- 1.00 packs/day for 27 years    Types: Cigarettes  . Smokeless tobacco: Not on file     Comment: chantix prescribed today, she is willing to quit  . Alcohol Use: 0.0 oz/week    0 Standard drinks or equivalent per week     Comment: rare  . Drug Use: No  . Sexual Activity: Not on file   Other Topics Concern  . Not on file   Social History Narrative   Works full time for [employer].  She is divorced and has 2 children.     Review of Systems: Constitutional: negative for chills, fever, night sweats, weight changes, or fatigue  HEENT: negative for vision changes, hearing loss, congestion, rhinorrhea, ST, epistaxis, or sinus pressure Cardiovascular: negative for chest pain or palpitations Respiratory: negative for hemoptysis, wheezing Abdominal: negative for abdominal pain, nausea, vomiting, diarrhea, or constipation Dermatological: negative for rash Neurologic: negative for headache, dizziness, or syncope All other systems reviewed and are otherwise negative with the exception to  those above and in the HPI.   Physical Exam: Blood pressure 152/110, pulse 118, temperature 98.7 F (37.1 C), temperature source Oral, resp. rate 17, height 5' 5.5" (1.664 m), weight 285 lb (129.275 kg), SpO2 99 %., Body mass index is 46.69 kg/(m^2). General: Well developed, well nourished, in no acute distress. Head: Normocephalic, atraumatic, eyes without discharge, sclera non-icteric, nares are without discharge. Bilateral auditory canals clear, TM's are without perforation, pearly grey and translucent with reflective cone of light bilaterally. Oral cavity moist, posterior pharynx without exudate, erythema, peritonsillar abscess, or post nasal drip.  Neck: Supple. No thyromegaly. Full  ROM. No lymphadenopathy. Lungs: Rales in left base. Breathing is unlabored. Heart: RRR with S1 S2. No murmurs, rubs, or gallops appreciated. Abdomen: Soft, non-tender, non-distended with normoactive bowel sounds. No hepatomegaly. No rebound/guarding. No obvious abdominal masses. Msk:  Strength and tone normal for age. Extremities/Skin: Warm and dry. No clubbing or cyanosis. No edema. No rashes or suspicious lesions. Neuro: Alert and oriented X 3. Moves all extremities spontaneously. Gait is normal. CNII-XII grossly in tact. Psych:  Responds to questions appropriately with a normal affect.     ASSESSMENT AND PLAN:  39 y.o. year old female with  This chart was scribed in my presence and reviewed by me personally.    ICD-9-CM ICD-10-CM   1. Acute bronchitis, unspecified organism 466.0 J20.9 predniSONE (DELTASONE) 20 MG tablet     HYDROcodone-acetaminophen (HYCET) 7.5-325 mg/15 ml solution     azithromycin (ZITHROMAX) 250 MG tablet     albuterol (PROVENTIL HFA;VENTOLIN HFA) 108 (90 BASE) MCG/ACT inhaler      Signed, Elvina Sidle, MD 12/23/2014 2:59 PM

## 2014-12-23 NOTE — Patient Instructions (Signed)

## 2014-12-30 IMAGING — US US ART/VEN ABD/PELV/SCROTUM DOPPLER LTD
1 series · 13 of 25 positions shown · non-contrast
Comparison: CT abdomen and pelvis February 01, 2014

CLINICAL DATA: 48 hr history of pelvic pain midline and toward the
right

EXAM:
TRANSABDOMINAL AND TRANSVAGINAL ULTRASOUND OF PELVIS
DOPPLER ULTRASOUND OF OVARIES
TECHNIQUE: Study was performed transabdominally to optimize pelvic field of
view evaluation and transvaginally to optimize internal visceral
architecture evaluation.
Color and duplex Doppler ultrasound was utilized to evaluate blood
flow to the ovaries.

[Series 1: us art/ven abd/pelv/scrotum doppler ltd · 0.28mm/px · 13 of 118 slices shown]
[im 1/118]
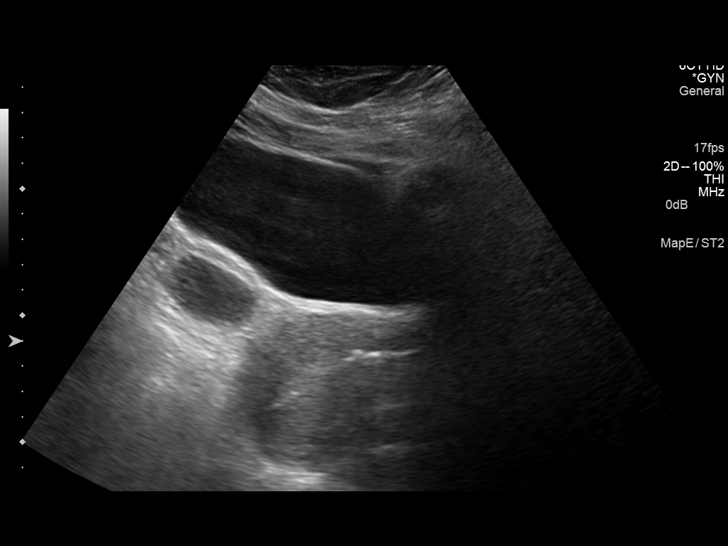
[im 10/118]
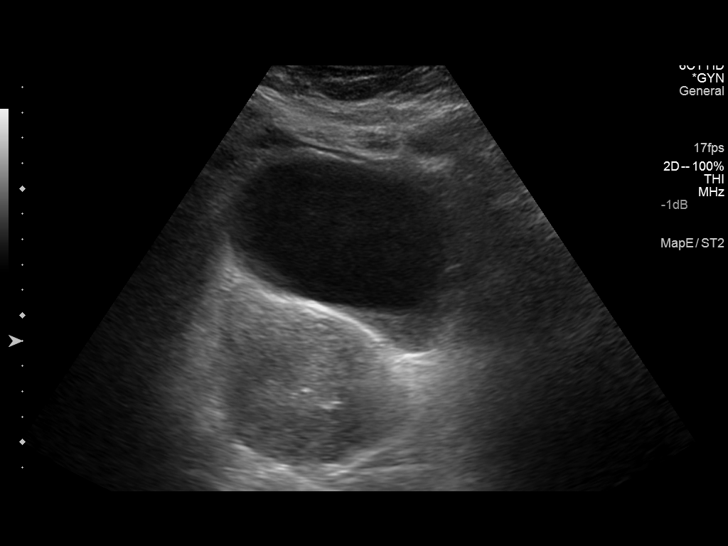
[im 20/118]
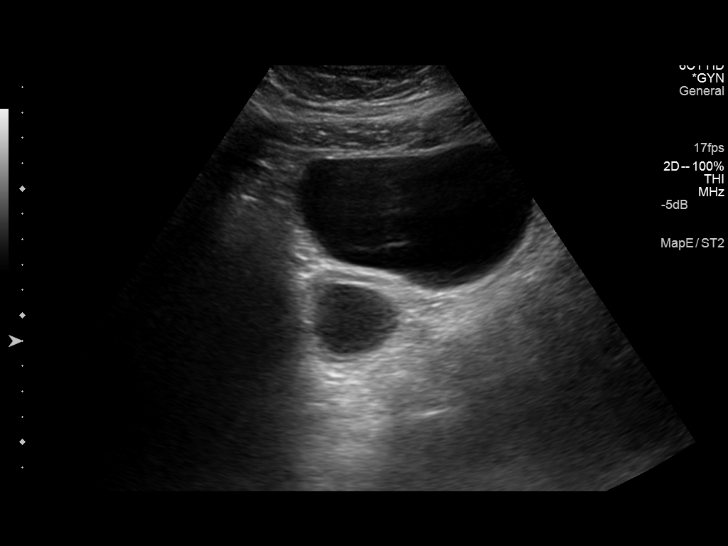
[im 30/118]
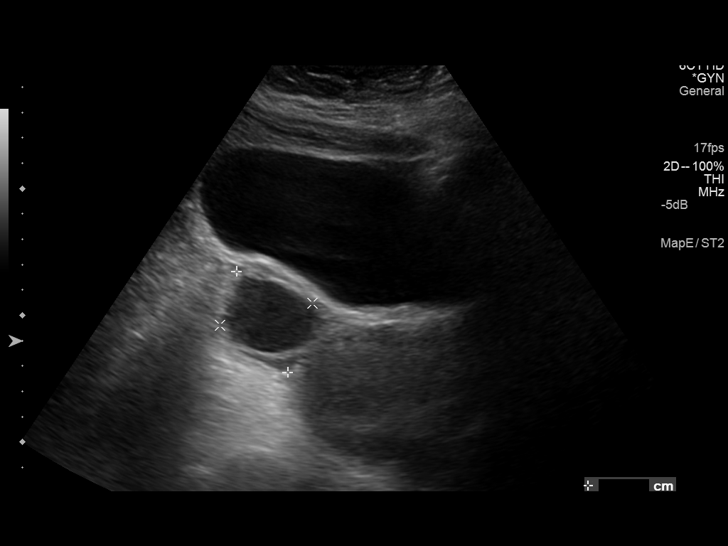
[im 40/118]
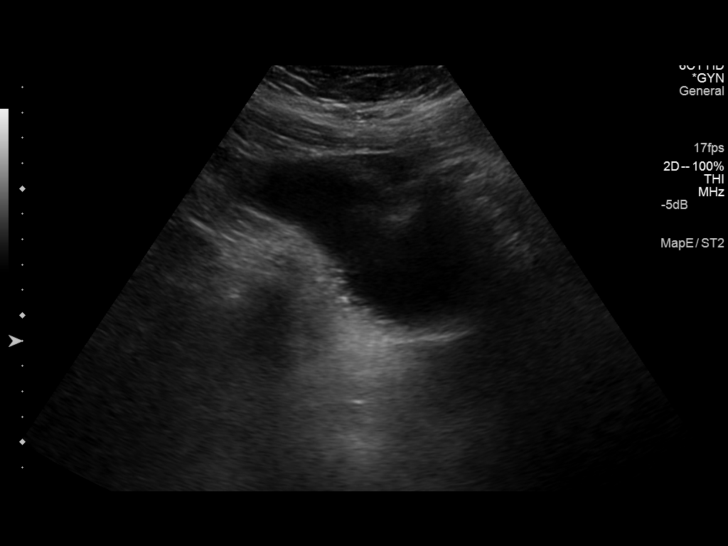
[im 49/118]
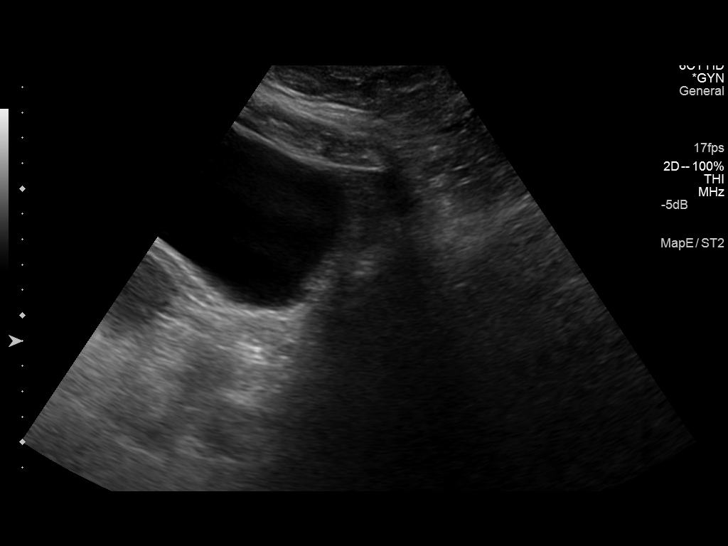
[im 59/118]
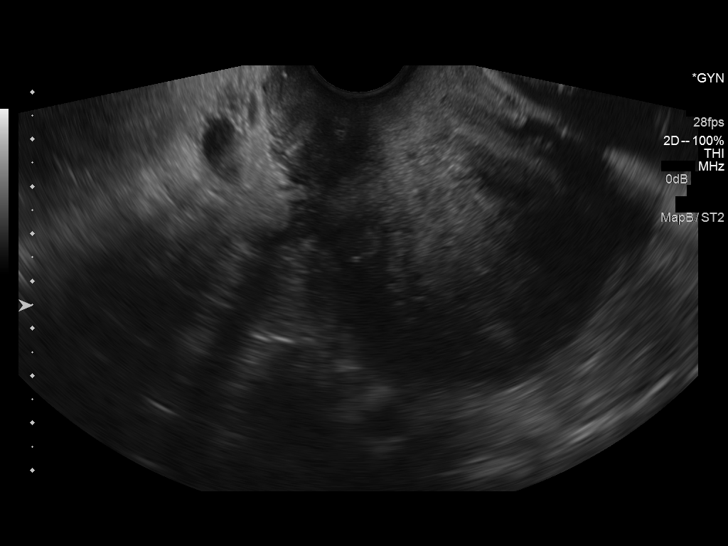
[im 69/118]
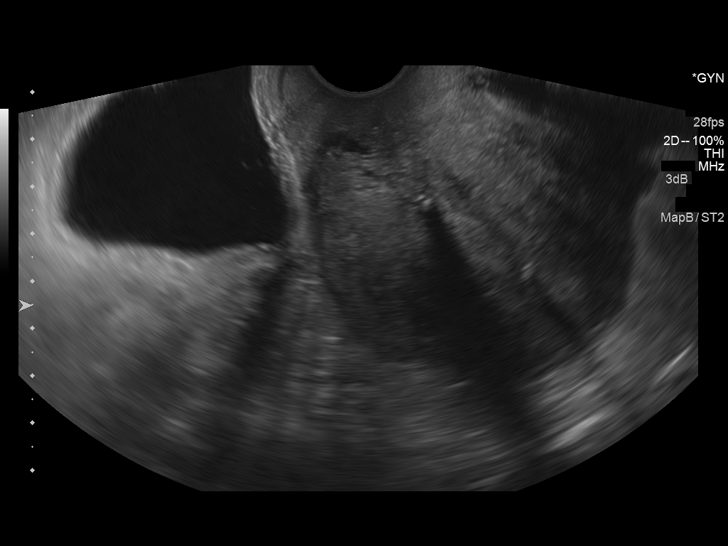
[im 79/118]
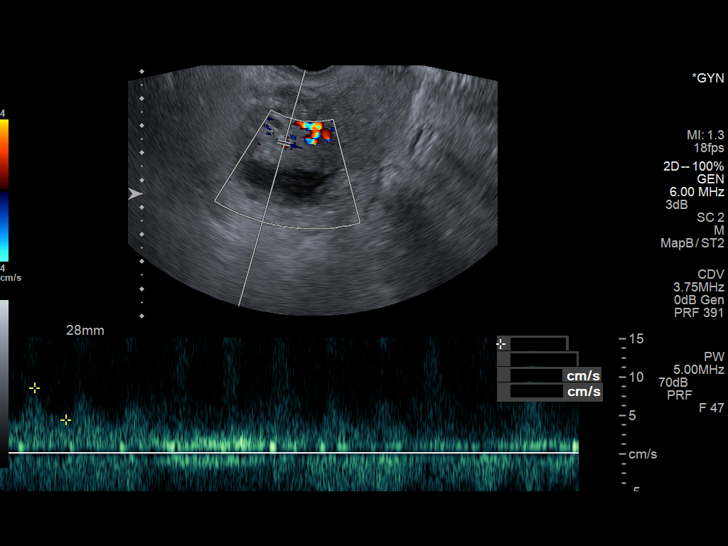
[im 88/118]
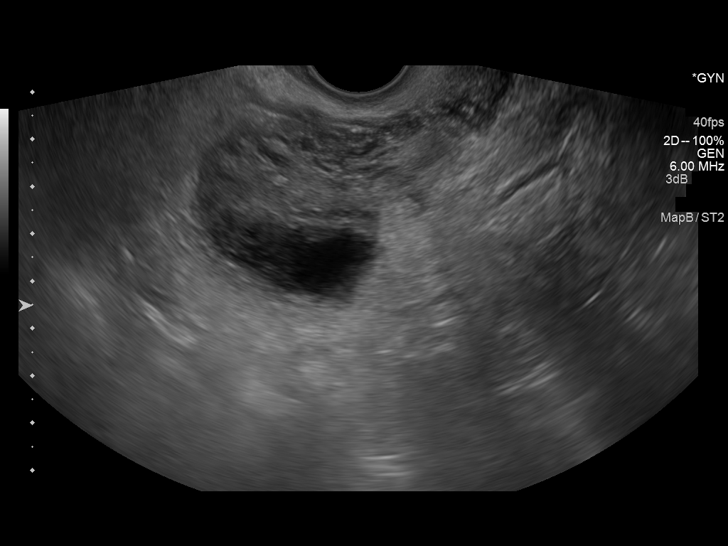
[im 98/118]
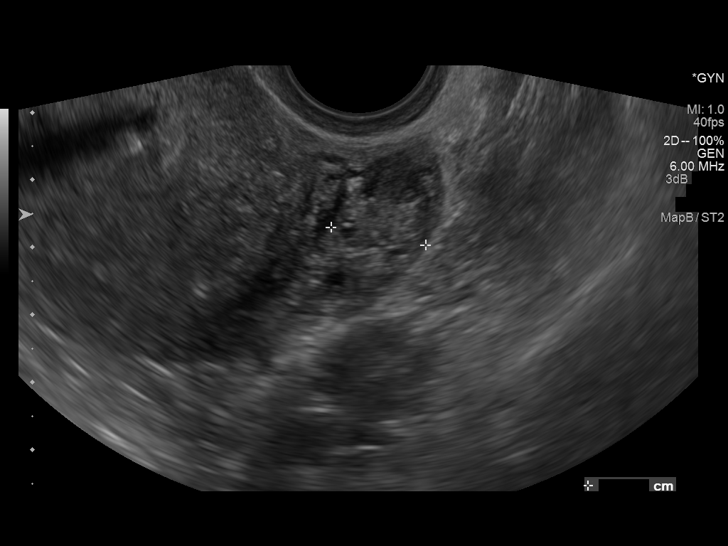
[im 108/118]
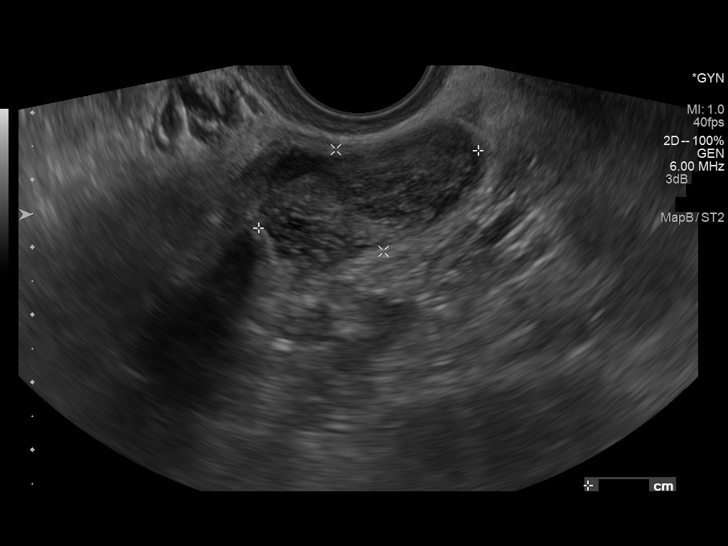
[im 118/118]
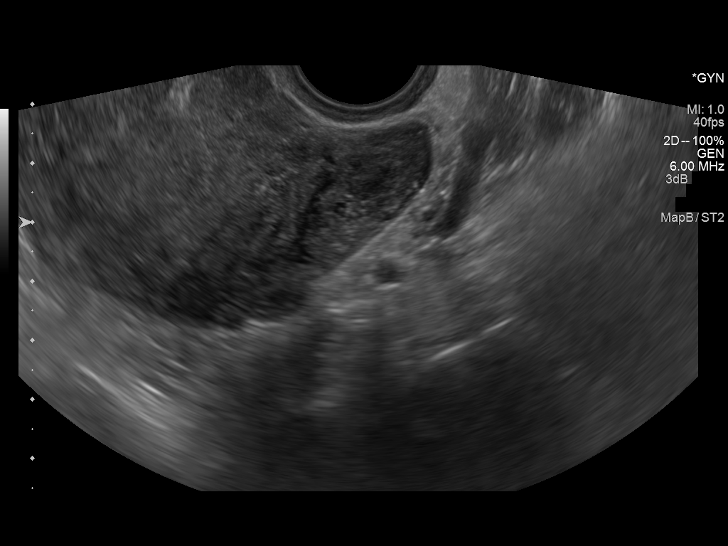

[13 of 25 positions shown; findings below may reference images not displayed]

FINDINGS: Uterus

Measurements: 8.7 x 5.8 x 6.8 cm. No fibroids or other mass
visualized. Uterus is retroverted.

Endometrium

Thickness: 8 mm. No focal abnormality visualized. Intrauterine
device present.

Right ovary

Measurements: 4.7 x 3.2 x 3.7 cm. There is a 3.4 x 2.7 x 3.1 cm
simple cyst present. No other extrauterine pelvic mass on the right.

Left ovary

Measurements: 3.5 x 1.7 x 1.4 cm. There is a hypoechoic 2.2 x 1.3 x
1.2 cm mass in the left ovary. No other extrauterine pelvic mass on
the left.

Pulsed Doppler evaluation of both ovaries demonstrates normal
low-resistance arterial and venous waveforms. Peak systolic velocity
in the left ovary is 19 cm/sec cm/sec. Peak systolic velocity in the
right ovary is 25.5 centimeter/second.

Other findings

Trace free fluid.
IMPRESSION: 3.4 x 2.7 x 3.1 cm simple cyst right ovary. Hypoechoic mass left
ovary of uncertain etiology measuring 2.2 x 1.3 x 1.2 cm.
Short-interval follow up ultrasound in 6-12 weeks is recommended,
preferably during the week following the patient's normal menses.

Uterus retroverted but otherwise normal in appearance. Intrauterine
device within the endometrium. Endometrium is normal in thickness.

No demonstrable ovarian torsion.

## 2014-12-30 IMAGING — CT CT ABD-PELV W/ CM
1 of 2 series · 15 of 32 positions shown, 19 images · IV contrast (OMNIPAQUE 300)
Comparison: None.

CLINICAL DATA: Severe abdominal and suprapubic pain. Diarrhea.
Elevated white blood cell count. Symptoms for 1 day.

EXAM:
CT ABDOMEN AND PELVIS WITH CONTRAST
TECHNIQUE: Multidetector CT imaging of the abdomen and pelvis was performed
using the standard protocol following bolus administration of
intravenous contrast.
CONTRAST:  100 mL OMNIPAQUE IOHEXOL 300 MG/ML SOLN, 50 mL OMNIPAQUE
IOHEXOL 300 MG/ML SOLN

[Series 2: abd/pel with · axial · 0.74mm/px · z∈[-354,+52]mm · 15 of 89 slices shown, 19 images]
[im 4/89  soft-tissue]
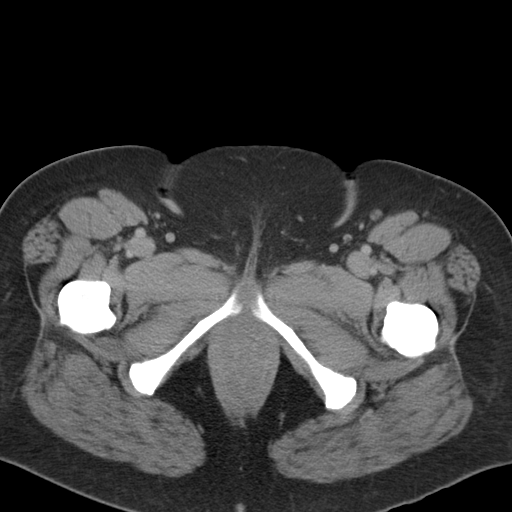
[im 4/89  bone]
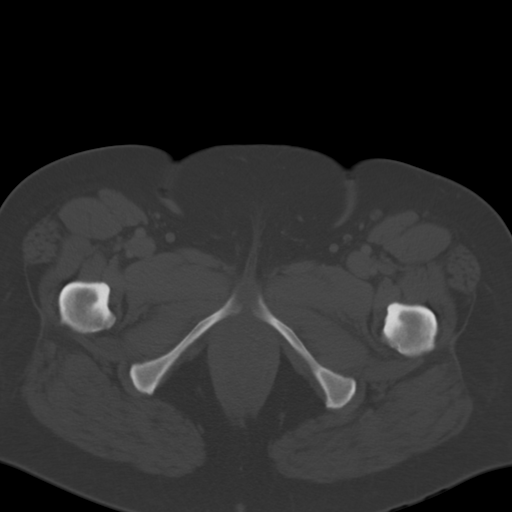
[im 12/89  soft-tissue]
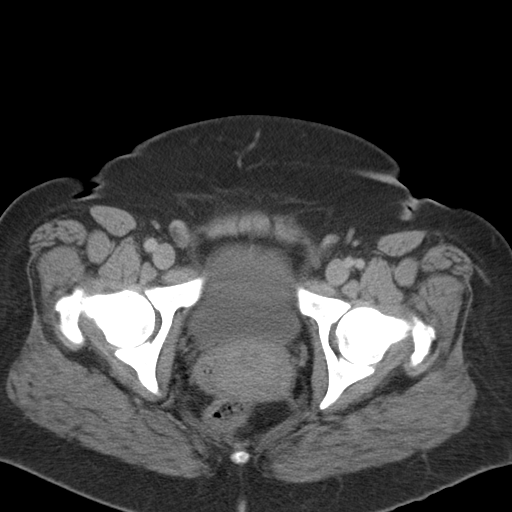
[im 19/89  soft-tissue]
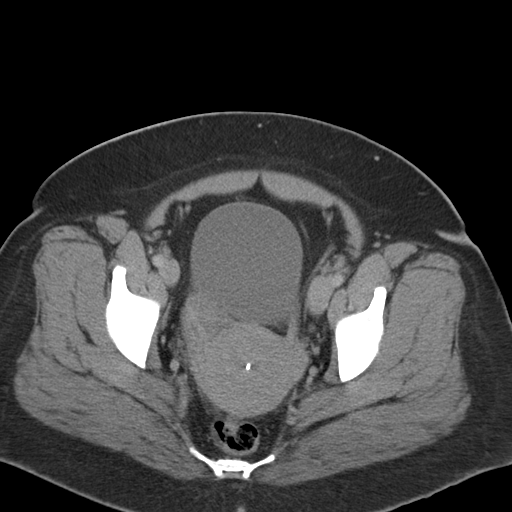
[im 26/89  soft-tissue]
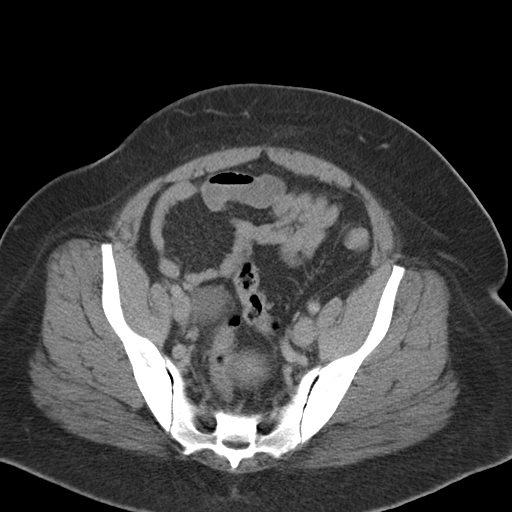
[im 30/89  soft-tissue]
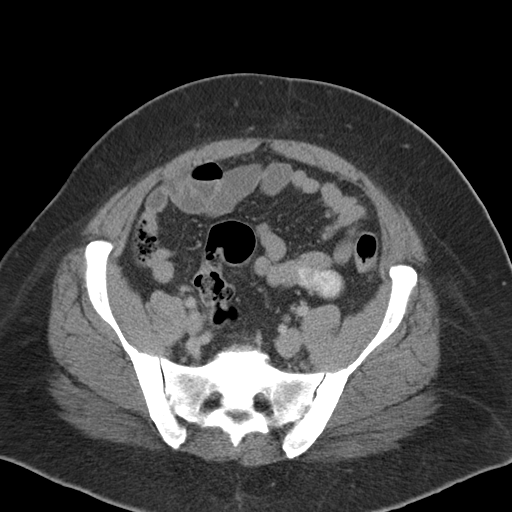
[im 37/89  soft-tissue]
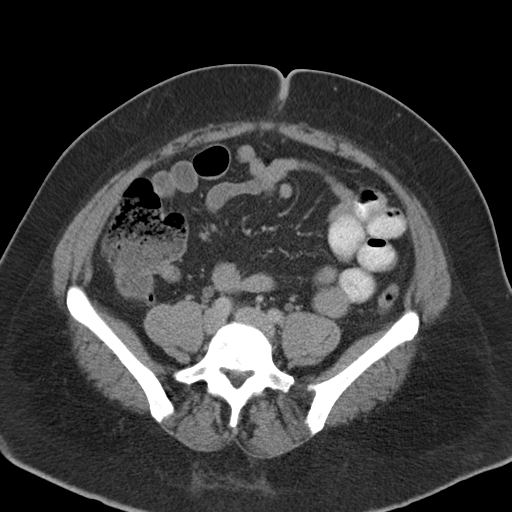
[im 45/89  soft-tissue]
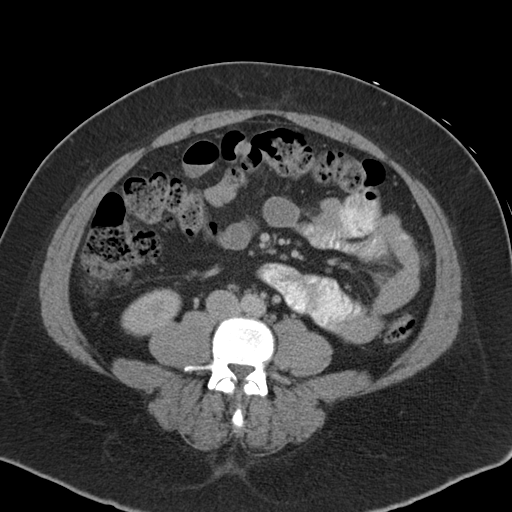
[im 52/89  soft-tissue]
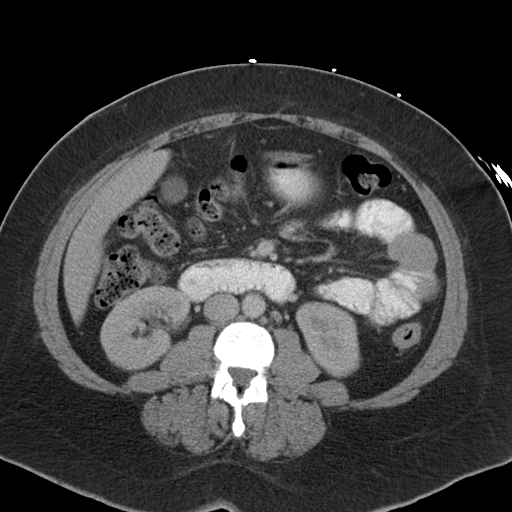
[im 59/89  soft-tissue]
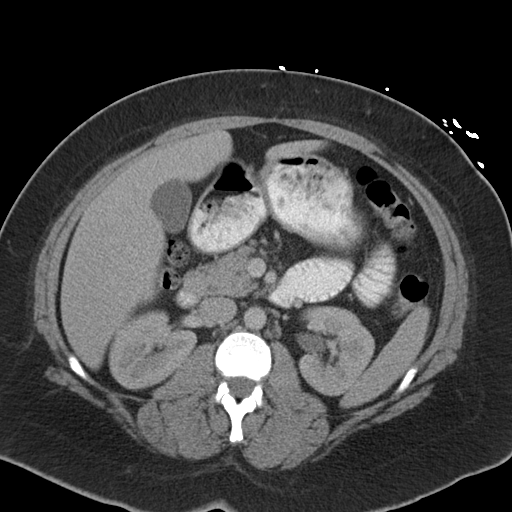
[im 59/89  bone]
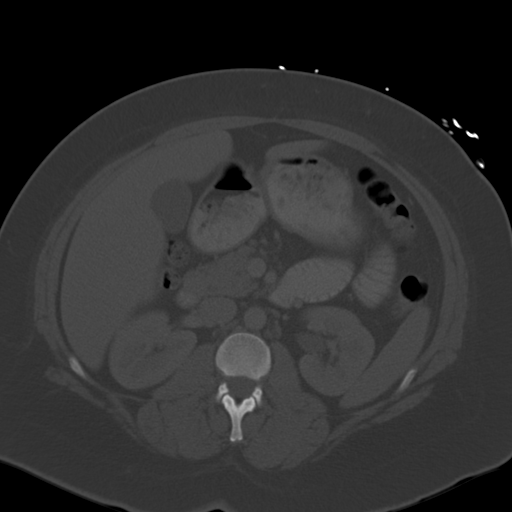
[im 63/89  soft-tissue]
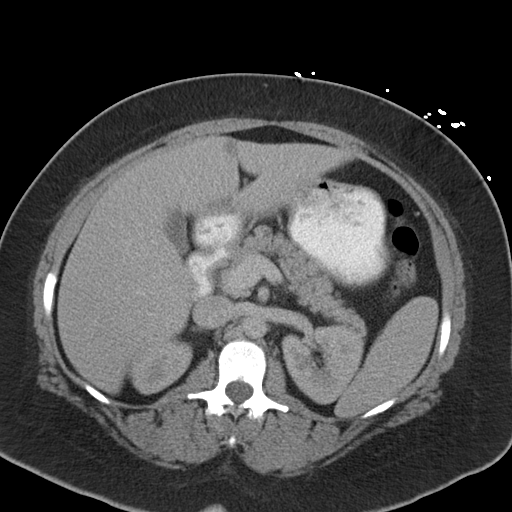
[im 70/89  soft-tissue]
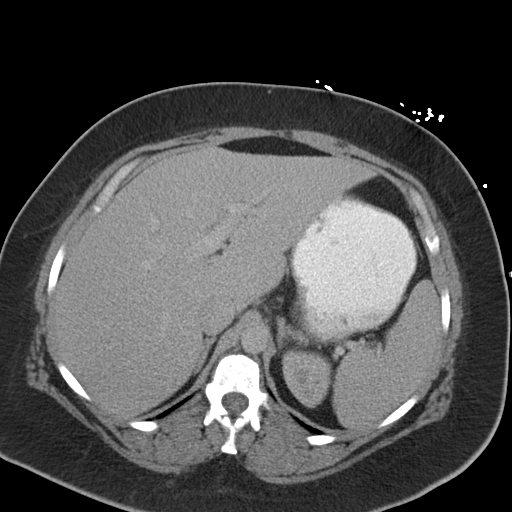
[im 74/89  lung]
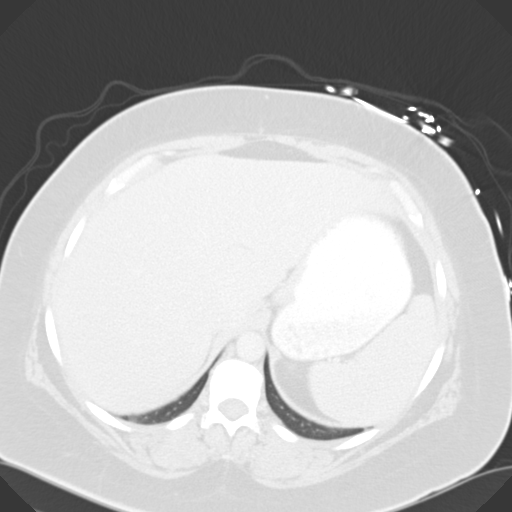
[im 78/89  soft-tissue]
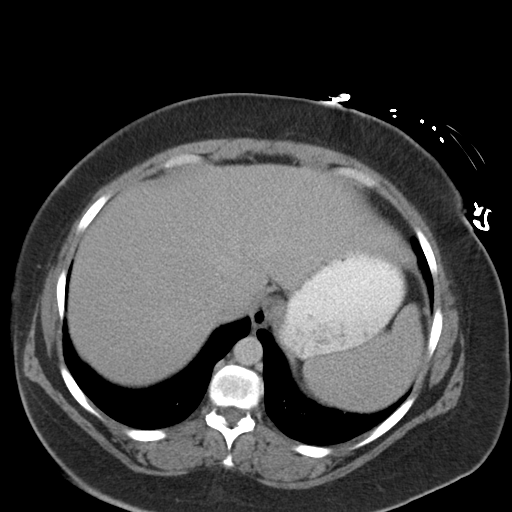
[im 78/89  lung]
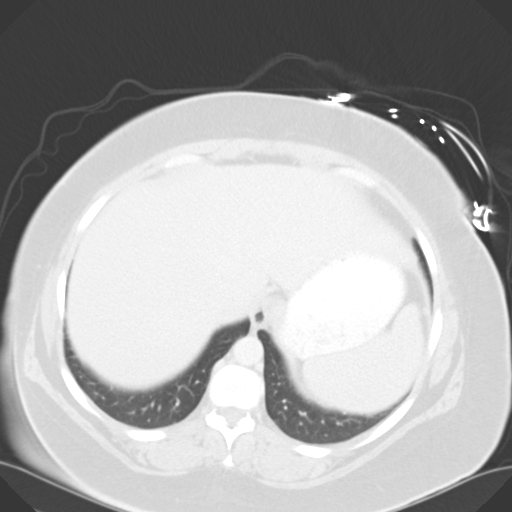
[im 81/89  lung]
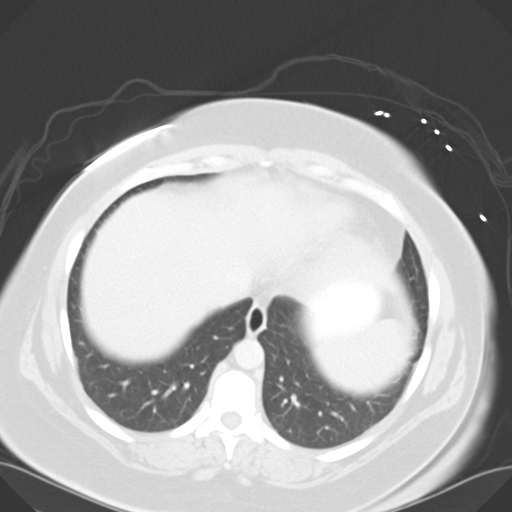
[im 85/89  soft-tissue]
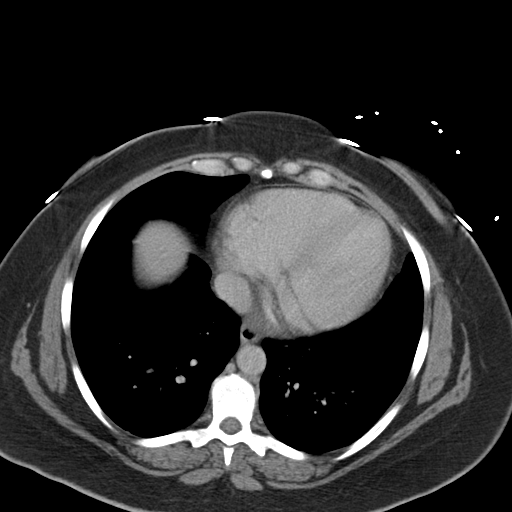
[im 85/89  lung]
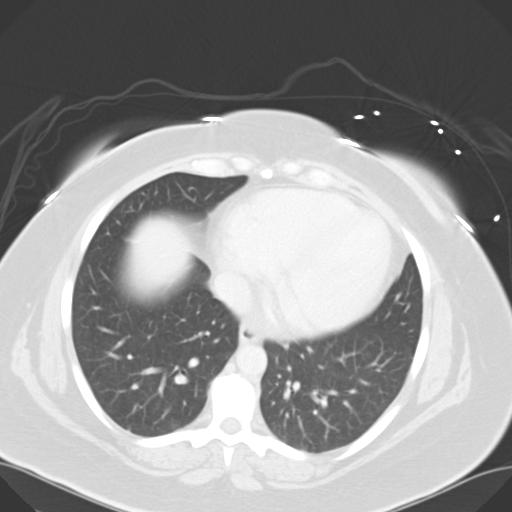

[15 of 32 positions shown; findings below may reference images not displayed]

FINDINGS: The lung bases are clear.  No pleural or pericardial effusion.

The gallbladder, liver, spleen, adrenal glands, pancreas, kidneys
and biliary tree all appear normal. The appendix is well seen and
normal in appearance. A few colonic diverticula are identified, most
notable in the sigmoid, but there is no evidence of diverticulitis.
The stomach and small bowel appear normal. A small amount of free
pelvic fluid is compatible with physiologic change. IUD is in place
in the uterus. 4.3 cm right ovarian cyst is incidentally noted and
likely a functional cyst. No lymphadenopathy is identified. No focal
bony abnormality is seen. Tiny fat containing umbilical hernia is
noted.
IMPRESSION: No acute finding in the abdomen or pelvis. Negative for
appendicitis.

Small right hip joint effusion may be incidental. Recommend clinical
correlation for right hip pain.

## 2015-01-27 ENCOUNTER — Other Ambulatory Visit: Payer: Self-pay | Admitting: Emergency Medicine

## 2015-03-01 ENCOUNTER — Ambulatory Visit (INDEPENDENT_AMBULATORY_CARE_PROVIDER_SITE_OTHER): Payer: Self-pay | Admitting: Family Medicine

## 2015-03-01 VITALS — BP 150/96 | HR 119 | Temp 98.3°F | Resp 18 | Ht 66.0 in | Wt 286.0 lb

## 2015-03-01 DIAGNOSIS — B009 Herpesviral infection, unspecified: Secondary | ICD-10-CM

## 2015-03-01 DIAGNOSIS — F329 Major depressive disorder, single episode, unspecified: Secondary | ICD-10-CM

## 2015-03-01 DIAGNOSIS — R600 Localized edema: Secondary | ICD-10-CM

## 2015-03-01 DIAGNOSIS — I10 Essential (primary) hypertension: Secondary | ICD-10-CM

## 2015-03-01 DIAGNOSIS — F32A Depression, unspecified: Secondary | ICD-10-CM

## 2015-03-01 MED ORDER — DULOXETINE HCL 60 MG PO CPEP: ORAL_CAPSULE | ORAL | Status: DC

## 2015-03-01 MED ORDER — CHLORTHALIDONE 25 MG PO TABS: 25.0000 mg | ORAL_TABLET | Freq: Every day | ORAL | Status: DC

## 2015-03-01 MED ORDER — ACYCLOVIR 400 MG PO TABS: 400.0000 mg | ORAL_TABLET | Freq: Two times a day (BID) | ORAL | Status: DC

## 2015-03-01 NOTE — Progress Notes (Signed)
Subjective:  This chart was scribed for Valerie SidleKurt Trinitie Mcgirr, MD by Broadus Johnawaa Al Shannon, Medical Scribe. This patient was seen in Room 11 and the patient's care was started at 1:25 PM.   Patient ID: Valerie Shannon, female    DOB: 11/14/1975, 39 y.o.   MRN: [id number]   Chief Complaint  Patient presents with  . Medication Refill    valtrex, cymbalta  . Edema    foot and ankles x2 weeks now     HPI HPI Comments: Valerie Shannon is a 39 y.o. female who presents to [facility] requesting medication refills.  Pt notes that she was diagnosed with bronchitis 2 months ago that resulted in severe voice hoarseness that she still currently suffers from, however she indicates that it is slowly healing. Pt denies sore throat, or cough that is different from normal. She notes that she has recently completed a sleep study that diagnosed her with sleep apnea.  Pt is a smoker, and she notes that she is planning to quit it to improve her symptoms.   Pt reports that she no longer have insurance, and she is requesting a change to her medications such as Valtrex due to the expenses. She is requesting refills for Cymbalta.   Pt also states that she has been suffering from edema secondary to her elevated blood pressure that started elevating after being laid off work. Pt is requesting a fluid pill.   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    Patient Active Problem List   Diagnosis Date Noted  . Chest pain 12/11/2010  . Tobacco abuse 12/11/2010  . PREMATURE VENTRICULAR CONTRACTIONS, FREQUENT 05/26/2010  . OVERWEIGHT/OBESITY 05/23/2010  . PALPITATIONS 05/23/2010   Past Medical History  Diagnosis Date  . Premature ventricular contractions   . Obesity   . Tobacco dependence   . Depression   . Anxiety   . Substance abuse     cocaine, in remission, > 10 years ago   Past Surgical History  Procedure Laterality Date  . Bilateral tubal ligation   1997  . Tubal ligation     Allergies  Allergen Reactions  . Latex     Rash if wears for a long period of time   Prior to Admission medications   Medication Sig Start Date End Date Taking? Authorizing Provider  albuterol (PROVENTIL HFA;VENTOLIN HFA) 108 (90 BASE) MCG/ACT inhaler Inhale 2 puffs into the lungs every 4 (four) hours as needed for wheezing or shortness of breath (cough, shortness of breath or wheezing.). 12/23/14  Yes Valerie SidleKurt Clint Biello, MD  DULoxetine (CYMBALTA) 60 MG capsule Take 1 capsule by mouth  daily 03/28/14  Yes Thao P Le, DO  levonorgestrel (MIRENA) 20 MCG/24HR IUD 1 each by Intrauterine route once.   Yes Historical Provider, MD  omeprazole (PRILOSEC) 20 MG capsule Take 20 mg by mouth daily.   Yes Historical Provider, MD  valACYclovir (VALTREX) 1000 MG tablet TAKE 1 TABLET BY MOUTH EVERY DAY  "OV NEEDED FOR ADDITIONAL REFILLS" 01/28/15  Yes Valerie Jeffery, PA-C  azithromycin (ZITHROMAX) 250 MG tablet Take 2 tabs PO x 1 dose, then 1 tab PO QD x 4 days Patient not taking: Reported on 03/01/2015 12/23/14   Valerie SidleKurt Topacio Cella, MD  clonazePAM (KLONOPIN) 0.5 MG tablet Take 1/2-1 tab PO BID prn Patient not taking: Reported on 12/23/2014 03/20/14   Thao P Le, DO  HYDROcodone-acetaminophen (HYCET) 7.5-325 mg/15 ml solution Take 10-15 mLs  by mouth every 6 (six) hours as needed. Patient not taking: Reported on 03/01/2015 12/23/14   Valerie Sidle, MD  predniSONE (DELTASONE) 20 MG tablet Two daily with food Patient not taking: Reported on 03/01/2015 12/23/14   Valerie Sidle, MD   Social History   Social History  . Marital Status: Single    Spouse Name: N/A  . Number of Children: N/A  . Years of Education: N/A   Occupational History  . Not on file.   Social History Main Topics  . Smoking status: Current Every Day Smoker -- 1.00 packs/day for 27 years    Types: Cigarettes  . Smokeless tobacco: Not on file     Comment: chantix prescribed today, she is willing to quit  . Alcohol Use:  0.0 oz/week    0 Standard drinks or equivalent per week     Comment: rare  . Drug Use: No  . Sexual Activity: Not on file   Other Topics Concern  . Not on file   Social History Narrative   Works full time for [employer].  She is divorced and has 2 children.    Review of Systems  HENT: Positive for voice change. Negative for sore throat.   Respiratory: Negative for cough.   Cardiovascular: Positive for leg swelling.      Objective:   Physical Exam  Constitutional: She is oriented to person, place, and time. She appears well-developed and well-nourished. No distress.  HENT:  Head: Normocephalic and atraumatic.  Eyes: EOM are normal. Pupils are equal, round, and reactive to light.  Neck: Neck supple.  Cardiovascular: Normal rate.   Pulmonary/Chest: Effort normal.  Neurological: She is alert and oriented to person, place, and time. No cranial nerve deficit.  Skin: Skin is warm and dry.  Psychiatric: She has a normal mood and affect. Her behavior is normal.  Nursing note and vitals reviewed.  1+ bilateral pedal edema Patient is hoarse  BP 150/96 mmHg  Pulse 119  Temp(Src) 98.3 F (36.8 C) (Oral)  Resp 18  Ht  (1.676 m)  Wt 286 lb (129.729 kg)  BMI 46.18 kg/m2  SpO2 97%      Assessment & Plan:     By signing my name below, I, Valerie Shannon, attest that this documentation has been prepared under the direction and in the presence of Valerie Sidle, MD.  Broadus John, Medical Scribe. 03/01/2015.  1:35 PM.  This chart was scribed in my presence and reviewed by me personally.    ICD-9-CM ICD-10-CM   1. Depression 311 F32.9 DULoxetine (CYMBALTA) 60 MG capsule  2. HSV (herpes simplex virus) infection 054.9 B00.9 acyclovir (ZOVIRAX) 400 MG tablet  3. Essential hypertension 401.9 I10   4. Pedal edema 782.3 R60.0 chlorthalidone (HYGROTON) 25 MG tablet   Urged to quit smoking  Signed, Valerie Sidle, MD

## 2015-03-01 NOTE — Patient Instructions (Signed)
Hypertension Hypertension, commonly called high blood pressure, is when the force of blood pumping through your arteries is too strong. Your arteries are the blood vessels that carry blood from your heart throughout your body. A blood pressure reading consists of a higher number over a lower number, such as 110/72. The higher number (systolic) is the pressure inside your arteries when your heart pumps. The lower number (diastolic) is the pressure inside your arteries when your heart relaxes. Ideally you want your blood pressure below 120/80. Hypertension forces your heart to work harder to pump blood. Your arteries may become narrow or stiff. Having untreated or uncontrolled hypertension can cause heart attack, stroke, kidney disease, and other problems. RISK FACTORS Some risk factors for high blood pressure are controllable. Others are not.  Risk factors you cannot control include:   Race. You may be at higher risk if you are African American.  Age. Risk increases with age.  Gender. Men are at higher risk than women before age 45 years. After age 65, women are at higher risk than men. Risk factors you can control include:  Not getting enough exercise or physical activity.  Being overweight.  Getting too much fat, sugar, calories, or salt in your diet.  Drinking too much alcohol. SIGNS AND SYMPTOMS Hypertension does not usually cause signs or symptoms. Extremely high blood pressure (hypertensive crisis) may cause headache, anxiety, shortness of breath, and nosebleed. DIAGNOSIS To check if you have hypertension, your health care provider will measure your blood pressure while you are seated, with your arm held at the level of your heart. It should be measured at least twice using the same arm. Certain conditions can cause a difference in blood pressure between your right and left arms. A blood pressure reading that is higher than normal on one occasion does not mean that you need treatment. If  it is not clear whether you have high blood pressure, you may be asked to return on a different day to have your blood pressure checked again. Or, you may be asked to monitor your blood pressure at home for 1 or more weeks. TREATMENT Treating high blood pressure includes making lifestyle changes and possibly taking medicine. Living a healthy lifestyle can help lower high blood pressure. You may need to change some of your habits. Lifestyle changes may include:  Following the DASH diet. This diet is high in fruits, vegetables, and whole grains. It is low in salt, red meat, and added sugars.  Keep your sodium intake below 2,300 mg per day.  Getting at least 30-45 minutes of aerobic exercise at least 4 times per week.  Losing weight if necessary.  Not smoking.  Limiting alcoholic beverages.  Learning ways to reduce stress. Your health care provider may prescribe medicine if lifestyle changes are not enough to get your blood pressure under control, and if one of the following is true:  You are 18-59 years of age and your systolic blood pressure is above 140.  You are 60 years of age or older, and your systolic blood pressure is above 150.  Your diastolic blood pressure is above 90.  You have diabetes, and your systolic blood pressure is over 140 or your diastolic blood pressure is over 90.  You have kidney disease and your blood pressure is above 140/90.  You have heart disease and your blood pressure is above 140/90. Your personal target blood pressure may vary depending on your medical conditions, your age, and other factors. HOME CARE INSTRUCTIONS    Have your blood pressure rechecked as directed by your health care provider.   Take medicines only as directed by your health care provider. Follow the directions carefully. Blood pressure medicines must be taken as prescribed. The medicine does not work as well when you skip doses. Skipping doses also puts you at risk for  problems.  Do not smoke.   Monitor your blood pressure at home as directed by your health care provider. SEEK MEDICAL CARE IF:   You think you are having a reaction to medicines taken.  You have recurrent headaches or feel dizzy.  You have swelling in your ankles.  You have trouble with your vision. SEEK IMMEDIATE MEDICAL CARE IF:  You develop a severe headache or confusion.  You have unusual weakness, numbness, or feel faint.  You have severe chest or abdominal pain.  You vomit repeatedly.  You have trouble breathing. MAKE SURE YOU:   Understand these instructions.  Will watch your condition.  Will get help right away if you are not doing well or get worse.   This information is not intended to replace advice given to you by your health care provider. Make sure you discuss any questions you have with your health care provider.   Document Released: 04/14/2005 Document Revised: 08/29/2014 Document Reviewed: 02/04/2013 Elsevier Interactive Patient Education 2016 [employer]  

## 2016-09-02 ENCOUNTER — Telehealth: Payer: Self-pay

## 2016-09-02 NOTE — Telephone Encounter (Signed)
PATIENT IS REQUESTING A REFILL ON ACYLOVIR (ZOVIRAX) 400 MG. IT WAS LAST REFILLED BY DR. Milus GlazierLAUENSTEIN. SHE HAD 2 REFILLS LEFT BUT IT SAYS TO FILL BEFORE NOV. 2017. SHE SPACES OUT THE MEDICINE SINCE SHE DOES NOT HAVE INSURANCE. BEST PHONE [phone] (CELL) PHARMACY CHOICE IS [facility] ON [address].  MBC

## 2016-09-02 NOTE — Telephone Encounter (Signed)
Last seen 2016 We cant fill correct?

## 2016-09-03 NOTE — Telephone Encounter (Signed)
unfortunately the patient needs to be be seen yearly for continued refills.

## 2016-09-05 NOTE — Telephone Encounter (Signed)
l/m with needs ov

## 2016-09-24 ENCOUNTER — Ambulatory Visit: Payer: Self-pay | Admitting: Emergency Medicine

## 2016-09-25 ENCOUNTER — Encounter: Payer: Self-pay | Admitting: Emergency Medicine

## 2016-09-25 ENCOUNTER — Ambulatory Visit (INDEPENDENT_AMBULATORY_CARE_PROVIDER_SITE_OTHER): Payer: Self-pay | Admitting: Emergency Medicine

## 2016-09-25 VITALS — BP 158/109 | HR 100 | Temp 98.2°F | Resp 16 | Ht 66.0 in | Wt 281.2 lb

## 2016-09-25 DIAGNOSIS — I10 Essential (primary) hypertension: Secondary | ICD-10-CM | POA: Insufficient documentation

## 2016-09-25 DIAGNOSIS — B009 Herpesviral infection, unspecified: Secondary | ICD-10-CM

## 2016-09-25 DIAGNOSIS — F32A Depression, unspecified: Secondary | ICD-10-CM | POA: Insufficient documentation

## 2016-09-25 DIAGNOSIS — F329 Major depressive disorder, single episode, unspecified: Secondary | ICD-10-CM

## 2016-09-25 MED ORDER — DULOXETINE HCL 30 MG PO CPEP
30.0000 mg | ORAL_CAPSULE | Freq: Two times a day (BID) | ORAL | 5 refills | Status: DC
Start: 2016-09-25 — End: 2018-02-26

## 2016-09-25 MED ORDER — LISINOPRIL 10 MG PO TABS: 10.0000 mg | ORAL_TABLET | Freq: Every day | ORAL | 3 refills | Status: DC

## 2016-09-25 MED ORDER — ACYCLOVIR 400 MG PO TABS: 400.0000 mg | ORAL_TABLET | Freq: Two times a day (BID) | ORAL | 11 refills | Status: DC

## 2016-09-25 NOTE — Patient Instructions (Addendum)
   IF you received an x-ray today, you will receive an invoice from [facility]. Please contact [facility] at [phone] with questions or concerns regarding your invoice.   IF you received labwork today, you will receive an invoice from [facility]. Please contact [facility] at [phone] with questions or concerns regarding your invoice.   Our billing staff will not be able to assist you with questions regarding bills from these companies.  You will be contacted with the lab results as soon as they are available. The fastest way to get your results is to activate your My Chart account. Instructions are located on the last page of this paperwork. If you have not heard from us regarding the results in 2 weeks, please contact this office.     Hypertension Hypertension is another name for high blood pressure. High blood pressure forces your heart to work harder to pump blood. This can cause problems over time. There are two numbers in a blood pressure reading. There is a top number (systolic) over a bottom number (diastolic). It is best to have a blood pressure below 120/80. Healthy choices can help lower your blood pressure. You may need medicine to help lower your blood pressure if:  Your blood pressure cannot be lowered with healthy choices.  Your blood pressure is higher than 130/80.  Follow these instructions at home: Eating and drinking  If directed, follow the DASH eating plan. This diet includes: ? Filling half of your plate at each meal with fruits and vegetables. ? Filling one quarter of your plate at each meal with whole grains. Whole grains include whole wheat pasta, brown rice, and whole grain bread. ? Eating or drinking low-fat dairy products, such as skim milk or low-fat yogurt. ? Filling one quarter of your plate at each meal with low-fat (lean) proteins. Low-fat proteins include fish, skinless chicken, eggs, beans, and tofu. ? Avoiding fatty meat, cured  and processed meat, or chicken with skin. ? Avoiding premade or processed food.  Eat less than 1,500 mg of salt (sodium) a day.  Limit alcohol use to no more than 1 drink a day for nonpregnant women and 2 drinks a day for men. One drink equals 12 oz of beer, 5 oz of wine, or 1 oz of hard liquor. Lifestyle  Work with your doctor to stay at a healthy weight or to lose weight. Ask your doctor what the best weight is for you.  Get at least 30 minutes of exercise that causes your heart to beat faster (aerobic exercise) most days of the week. This may include walking, swimming, or biking.  Get at least 30 minutes of exercise that strengthens your muscles (resistance exercise) at least 3 days a week. This may include lifting weights or pilates.  Do not use any products that contain nicotine or tobacco. This includes cigarettes and e-cigarettes. If you need help quitting, ask your doctor.  Check your blood pressure at home as told by your doctor.  Keep all follow-up visits as told by your doctor. This is important. Medicines  Take over-the-counter and prescription medicines only as told by your doctor. Follow directions carefully.  Do not skip doses of blood pressure medicine. The medicine does not work as well if you skip doses. Skipping doses also puts you at risk for problems.  Ask your doctor about side effects or reactions to medicines that you should watch for. Contact a doctor if:  You think you are having a reaction to the   the medicine you are taking.  You have headaches that keep coming back (recurring).  You feel dizzy.  You have swelling in your ankles.  You have trouble with your vision. Get help right away if:  You get a very bad headache.  You start to feel confused.  You feel weak or numb.  You feel faint.  You get very bad pain in your: ? Chest. ? Belly (abdomen).  You throw up (vomit) more than once.  You have trouble  breathing. Summary  Hypertension is another name for high blood pressure.  Making healthy choices can help lower blood pressure. If your blood pressure cannot be controlled with healthy choices, you may need to take medicine. This information is not intended to replace advice given to you by your health care provider. Make sure you discuss any questions you have with your health care provider. Document Released: 10/01/2007 Document Revised: 03/12/2016 Document Reviewed: 03/12/2016 Elsevier Interactive Patient Education  2018 [employer].  Major Depressive Disorder, Adult Major depressive disorder (MDD) is a mental health condition. MDD often makes you feel sad, hopeless, or helpless. MDD can also cause symptoms in your body. MDD can affect your:  Work.  School.  Relationships.  Other normal activities.  MDD can range from mild to very bad. It may occur once (single episode MDD). It can also occur many times (recurrent MDD). The main symptoms of MDD often include:  Feeling sad, depressed, or irritable most of the time.  Loss of interest.  MDD symptoms also include:  Sleeping too much or too little.  Eating too much or too little.  A change in your weight.  Feeling tired (fatigue) or having low energy.  Feeling worthless.  Feeling guilty.  Trouble making decisions.  Trouble thinking clearly.  Thoughts of suicide or harming others.  Feeling weak.  Feeling agitated.  Keeping yourself from being around other people (isolation).  Follow these instructions at home: Activity  Do these things as told by your doctor: ? Go back to your normal activities. ? Exercise regularly. ? Spend time outdoors. Alcohol  Talk with your doctor about how alcohol can affect your antidepressant medicines.  Do not drink alcohol. Or, limit how much alcohol you drink. ? This means no more than 1 drink a day for nonpregnant women and 2 drinks a day for men. One drink equals one of  these:  12 oz of beer.  5 oz of wine.  1 oz of hard liquor. General instructions  Take over-the-counter and prescription medicines only as told by your doctor.  Eat a healthy diet.  Get plenty of sleep.  Find activities that you enjoy. Make time to do them.  Think about joining a support group. Your doctor may be able to suggest a group for you.  Keep all follow-up visits as told by your doctor. This is important. Where to find more information:  The [employer] on Mental Illness: ? www.nami.org  [place] [employer] of Mental Health: ? http://www.maynard.net/  National Suicide Prevention Lifeline: ? 3212097784. This is free, 24-hour help. Contact a doctor if:  Your symptoms get worse.  You have new symptoms. Get help right away if:  You self-harm.  You see, hear, taste, smell, or feel things that are not present (hallucinate). If you ever feel like you may hurt yourself or others, or have thoughts about taking your own life, get help right away. You can go to your nearest emergency department or call:  Your local emergency services (  911 in the [place]  A suicide crisis helpline, such as the National Suicide Prevention Lifeline: ? 684-607-24111-973-255-9275. This is open 24 hours a day.  This information is not intended to replace advice given to you by your health care provider. Make sure you discuss any questions you have with your health care provider. Document Released: 03/26/2015 Document Revised: 12/30/2015 Document Reviewed: 12/30/2015 Elsevier Interactive Patient Education  2017 [employer]

## 2016-09-25 NOTE — Progress Notes (Signed)
Valerie Shannon 41 y.o.   Chief Complaint  Patient presents with  . Medication Refill    Would like to restart cymbalta, need refill of acyclovir     HISTORY OF PRESENT ILLNESS: This is a 41 y.o. female complaining of depression coming back; would like to restart Cymbalta. Also has h/o HTN, on no meds, BP has been on the high side at home.  HPI   Prior to Admission medications   Medication Sig Start Date End Date Taking? Authorizing Provider  acyclovir (ZOVIRAX) 400 MG tablet Take 1 tablet (400 mg total) by mouth 2 (two) times daily. 03/01/15  Yes Valerie Shannon, Kurt, MD  albuterol (PROVENTIL HFA;VENTOLIN HFA) 108 (90 BASE) MCG/ACT inhaler Inhale 2 puffs into the lungs every 4 (four) hours as needed for wheezing or shortness of breath (cough, shortness of breath or wheezing.). 12/23/14  Yes Valerie Shannon, Kurt, MD  levonorgestrel (MIRENA) 20 MCG/24HR IUD 1 each by Intrauterine route once.   Yes [provider]  omeprazole (PRILOSEC) 20 MG capsule Take 20 mg by mouth daily.   Yes [provider]  azithromycin (ZITHROMAX) 250 MG tablet Take 2 tabs PO x 1 dose, then 1 tab PO QD x 4 days Patient not taking: Reported on 03/01/2015 12/23/14   Valerie Shannon, Kurt, MD  chlorthalidone (HYGROTON) 25 MG tablet Take 1 tablet (25 mg total) by mouth daily. Patient not taking: Reported on 09/25/2016 03/01/15   Valerie Shannon, Kurt, MD  clonazePAM (KLONOPIN) 0.5 MG tablet Take 1/2-1 tab PO BID prn Patient not taking: Reported on 12/23/2014 03/20/14   Valerie Shannon, Valerie P, DO  DULoxetine (CYMBALTA) 60 MG capsule Take 1 capsule by mouth  daily Patient not taking: Reported on 09/25/2016 03/01/15   Valerie Shannon, Kurt, MD  HYDROcodone-acetaminophen (HYCET) 7.5-325 mg/15 ml solution Take 10-15 mLs by mouth every 6 (six) hours as needed. Patient not taking: Reported on 03/01/2015 12/23/14   Valerie Shannon, Kurt, MD  predniSONE (DELTASONE) 20 MG tablet Two daily with food Patient not taking: Reported on 03/01/2015 12/23/14    Valerie Shannon, Kurt, MD    Allergies  Allergen Reactions  . Latex     Rash if wears for a long period of time    Patient Active Problem List   Diagnosis Date Noted  . Chest pain 12/11/2010  . Tobacco abuse 12/11/2010  . PREMATURE VENTRICULAR CONTRACTIONS, FREQUENT 05/26/2010  . OVERWEIGHT/OBESITY 05/23/2010  . PALPITATIONS 05/23/2010    Past Medical History:  Diagnosis Date  . Anxiety   . Depression   . Obesity   . Premature ventricular contractions   . Substance abuse    cocaine, in remission, > 10 years ago  . Tobacco dependence     Past Surgical History:  Procedure Laterality Date  . bilateral tubal ligation  1997  . TUBAL LIGATION      Social History   Social History  . Marital status: Single    Spouse name: N/A  . Number of children: N/A  . Years of education: N/A   Occupational History  . Not on file.   Social History Main Topics  . Smoking status: Current Every Day Smoker    Packs/day: 1.00    Years: 27.00    Types: Cigarettes, E-cigarettes  . Smokeless tobacco: Never Used     Comment: chantix prescribed today, she is willing to quit  . Alcohol use 0.0 oz/week     Comment: rare  . Drug use: No  . Sexual activity: Not on file   Other Topics Concern  .  Not on file   Social History Narrative   Works full time for [employer].  She is divorced and has 2 children.    Family History  Problem Relation Age of Onset  . Arrhythmia Unknown        father had a sudden cardiac arrest at age 35 and was rescucitated 3-4 years ago.  He has an ICD and is followed by Valerie Shannon Valerie Shannon.  Paternal grandfather died suddenly at age 54 at home.  . Depression Mother   . Heart disease Father   . ADD / ADHD Son   . Depression Maternal Grandmother   . Cancer Maternal Grandmother   . Depression Maternal Grandfather   . Cancer Paternal Grandmother   . Heart disease Paternal Grandmother   . Heart disease Paternal Grandfather   . Heart failure Father      Review  of Systems  Constitutional: Negative.  Negative for chills and fever.  HENT: Negative.   Eyes: Negative.   Respiratory: Negative.  Negative for cough and shortness of breath.   Cardiovascular: Negative.  Negative for chest pain, palpitations and leg swelling.  Gastrointestinal: Negative.  Negative for abdominal pain, diarrhea, nausea and vomiting.  Genitourinary: Negative.  Negative for dysuria and hematuria.  Musculoskeletal: Negative.  Negative for back pain, myalgias and neck pain.  Skin: Negative.  Negative for rash.  Neurological: Negative.  Negative for dizziness, sensory change, focal weakness, seizures and headaches.  Endo/Heme/Allergies: Negative.   Psychiatric/Behavioral: Positive for depression.  All other systems reviewed and are negative.  Vitals:   09/25/16 1520  BP: (!) 158/109  Pulse: 100  Resp: 16  Temp: 98.2 F (36.8 C)    Physical Exam  Constitutional: She appears well-developed and well-nourished.  HENT:  Head: Normocephalic and atraumatic.  Nose: Nose normal.  Mouth/Throat: Oropharynx is clear and moist. No oropharyngeal exudate.  Eyes: Conjunctivae and EOM are normal. Pupils are equal, round, and reactive to light.  Neck: Normal range of motion. Neck supple. No JVD present. No thyromegaly present.  Cardiovascular: Normal rate, regular rhythm, normal heart sounds and intact distal pulses.   Pulmonary/Chest: Effort normal and breath sounds normal.  Abdominal: Soft. Bowel sounds are normal. She exhibits no distension. There is no tenderness.  Skin: Skin is warm and dry. Capillary refill takes less than 2 seconds. No rash noted.  Psychiatric: She has a normal mood and affect. Her behavior is normal.  Vitals reviewed.    ASSESSMENT & PLAN: Valerie Shannon was seen today for medication refill.  Diagnoses and all orders for this visit:  Depression, unspecified depression type  HSV (herpes simplex virus) infection -     acyclovir (ZOVIRAX) 400 MG tablet; Take 1  tablet (400 mg total) by mouth 2 (two) times daily.  Essential hypertension, benign  Other orders -     DULoxetine (CYMBALTA) 30 MG capsule; Take 1 capsule (30 mg total) by mouth 2 (two) times daily. -     lisinopril (PRINIVIL,ZESTRIL) 10 MG tablet; Take 1 tablet (10 mg total) by mouth daily.    Patient Instructions       IF you received an x-ray today, you will receive an invoice from [facility]. Please contact [facility] at [phone] with questions or concerns regarding your invoice.   IF you received labwork today, you will receive an invoice from [facility]. Please contact [facility] at [phone] with questions or concerns regarding your invoice.   Our billing staff will not be able to assist you with  questions regarding bills from these companies.  You will be contacted with the lab results as soon as they are available. The fastest way to get your results is to activate your My Chart account. Instructions are located on the last page of this paperwork. If you have not heard from [place] regarding the results in 2 weeks, please contact this office.      Hypertension Hypertension is another name for high blood pressure. High blood pressure forces your heart to work harder to pump blood. This can cause problems over time. There are two numbers in a blood pressure reading. There is a top number (systolic) over a bottom number (diastolic). It is best to have a blood pressure below 120/80. Healthy choices can help lower your blood pressure. You may need medicine to help lower your blood pressure if:  Your blood pressure cannot be lowered with healthy choices.  Your blood pressure is higher than 130/80.  Follow these instructions at home: Eating and drinking  If directed, follow the DASH eating plan. This diet includes: ? Filling half of your plate at each meal with fruits and vegetables. ? Filling one quarter of your plate at each meal with whole grains. Whole  grains include whole wheat pasta, brown rice, and whole grain bread. ? Eating or drinking low-fat dairy products, such as skim milk or low-fat yogurt. ? Filling one quarter of your plate at each meal with low-fat (lean) proteins. Low-fat proteins include fish, skinless chicken, eggs, beans, and tofu. ? Avoiding fatty meat, cured and processed meat, or chicken with skin. ? Avoiding premade or processed food.  Eat less than 1,500 mg of salt (sodium) a day.  Limit alcohol use to no more than 1 drink a day for nonpregnant women and 2 drinks a day for men. One drink equals 12 oz of beer, 5 oz of wine, or 1 oz of hard liquor. Lifestyle  Work with your doctor to stay at a healthy weight or to lose weight. Ask your doctor what the best weight is for you.  Get at least 30 minutes of exercise that causes your heart to beat faster (aerobic exercise) most days of the week. This may include walking, swimming, or biking.  Get at least 30 minutes of exercise that strengthens your muscles (resistance exercise) at least 3 days a week. This may include lifting weights or pilates.  Do not use any products that contain nicotine or tobacco. This includes cigarettes and e-cigarettes. If you need help quitting, ask your doctor.  Check your blood pressure at home as told by your doctor.  Keep all follow-up visits as told by your doctor. This is important. Medicines  Take over-the-counter and prescription medicines only as told by your doctor. Follow directions carefully.  Do not skip doses of blood pressure medicine. The medicine does not work as well if you skip doses. Skipping doses also puts you at risk for problems.  Ask your doctor about side effects or reactions to medicines that you should watch for. Contact a doctor if:  You think you are having a reaction to the medicine you are taking.  You have headaches that keep coming back (recurring).  You feel dizzy.  You have swelling in your  ankles.  You have trouble with your vision. Get help right away if:  You get a very bad headache.  You start to feel confused.  You feel weak or numb.  You feel faint.  You get very bad pain in your: ?  Chest. ? Belly (abdomen).  You throw up (vomit) more than once.  You have trouble breathing. Summary  Hypertension is another name for high blood pressure.  Making healthy choices can help lower blood pressure. If your blood pressure cannot be controlled with healthy choices, you may need to take medicine. This information is not intended to replace advice given to you by your health care provider. Make sure you discuss any questions you have with your health care provider. Document Released: 10/01/2007 Document Revised: 03/12/2016 Document Reviewed: 03/12/2016 Elsevier Interactive Patient Education  2018 [employer].  Major Depressive Disorder, Adult Major depressive disorder (MDD) is a mental health condition. MDD often makes you feel sad, hopeless, or helpless. MDD can also cause symptoms in your body. MDD can affect your:  Work.  School.  Relationships.  Other normal activities.  MDD can range from mild to very bad. It may occur once (single episode MDD). It can also occur many times (recurrent MDD). The main symptoms of MDD often include:  Feeling sad, depressed, or irritable most of the time.  Loss of interest.  MDD symptoms also include:  Sleeping too much or too little.  Eating too much or too little.  A change in your weight.  Feeling tired (fatigue) or having low energy.  Feeling worthless.  Feeling guilty.  Trouble making decisions.  Trouble thinking clearly.  Thoughts of suicide or harming others.  Feeling weak.  Feeling agitated.  Keeping yourself from being around other people (isolation).  Follow these instructions at home: Activity  Do these things as told by your doctor: ? Go back to your normal activities. ? Exercise  regularly. ? Spend time outdoors. Alcohol  Talk with your doctor about how alcohol can affect your antidepressant medicines.  Do not drink alcohol. Or, limit how much alcohol you drink. ? This means no more than 1 drink a day for nonpregnant women and 2 drinks a day for men. One drink equals one of these:  12 oz of beer.  5 oz of wine.  1 oz of hard liquor. General instructions  Take over-the-counter and prescription medicines only as told by your doctor.  Eat a healthy diet.  Get plenty of sleep.  Find activities that you enjoy. Make time to do them.  Think about joining a support group. Your doctor may be able to suggest a group for you.  Keep all follow-up visits as told by your doctor. This is important. Where to find more information:  The [employer] on Mental Illness: ? www.nami.org  [place] [employer] of Mental Health: ? http://www.maynard.net/  National Suicide Prevention Lifeline: ? 309 133 7197. This is free, 24-hour help. Contact a doctor if:  Your symptoms get worse.  You have new symptoms. Get help right away if:  You self-harm.  You see, hear, taste, smell, or feel things that are not present (hallucinate). If you ever feel like you may hurt yourself or others, or have thoughts about taking your own life, get help right away. You can go to your nearest emergency department or call:  Your local emergency services (911 in the [place]  A suicide crisis helpline, such as the National Suicide Prevention Lifeline: ? (208) 573-3611. This is open 24 hours a day.  This information is not intended to replace advice given to you by your health care provider. Make sure you discuss any questions you have with your health care provider. Document Released: 03/26/2015 Document Revised: 12/30/2015 Document Reviewed: 12/30/2015 Elsevier Interactive Patient Education  2017  [employer]vier Inc.      Edwina Barth, MD [facility]

## 2017-02-16 ENCOUNTER — Telehealth: Payer: Self-pay

## 2017-02-16 NOTE — Telephone Encounter (Signed)
LMVM for patient to CB to review overdue health maintenance with me.

## 2018-01-10 ENCOUNTER — Other Ambulatory Visit: Payer: Self-pay | Admitting: Emergency Medicine

## 2018-01-11 NOTE — Telephone Encounter (Signed)
Lisinopril 10 mg  refill Last Refill:09/25/16 # 90 and 3 refills Last OV: 09/25/16 PCP: Claudell KyleM. Sagardia Pharmacy: [facility] [phone]  Prescription expired on 09/25/17. No upcoming office visits scheduled.

## 2018-01-11 NOTE — Telephone Encounter (Signed)
Prescription has expired and pt has not had office visit since 09/25/16.

## 2018-01-14 ENCOUNTER — Other Ambulatory Visit: Payer: Self-pay | Admitting: Emergency Medicine

## 2018-01-15 NOTE — Telephone Encounter (Signed)
[facility] [address] sent request for med. Per chart, [facility] [address] Called pharmacy and med was not on file.   Lisinopril 10 mg  refill Last Refill:09/25/16 # 90 3 RF Last OV: 09/25/16 PCP: Dr Alvy BimlerSagardia Pharmacy:[facility] [address] \

## 2018-01-15 NOTE — Telephone Encounter (Signed)
Called pt and left messagge for pt to call back to see if she has picked up medication, and if not what [facility] does she want RX sent to .

## 2018-01-21 ENCOUNTER — Other Ambulatory Visit: Payer: Self-pay | Admitting: Emergency Medicine

## 2018-01-21 DIAGNOSIS — B009 Herpesviral infection, unspecified: Secondary | ICD-10-CM

## 2018-01-22 NOTE — Telephone Encounter (Signed)
Zovirax refill Last Refill:09/25/16 # 60 with 11 refills Last OV: 09/25/16 PCP: Dr. Alvy Bimler Pharmacy:[facility] [address]

## 2018-01-28 ENCOUNTER — Telehealth: Payer: Self-pay | Admitting: Emergency Medicine

## 2018-01-28 DIAGNOSIS — B009 Herpesviral infection, unspecified: Secondary | ICD-10-CM

## 2018-02-12 NOTE — Telephone Encounter (Signed)
Informed pt why Rx was refused and advised pt to call back and make an appointment.

## 2018-02-12 NOTE — Telephone Encounter (Signed)
Pt wants to know why this medication continues to be denied for her.  Pt states she needs this medication ASAP.

## 2018-02-15 ENCOUNTER — Telehealth: Payer: Self-pay | Admitting: Physician Assistant

## 2018-02-15 ENCOUNTER — Other Ambulatory Visit: Payer: Self-pay

## 2018-02-15 DIAGNOSIS — B009 Herpesviral infection, unspecified: Secondary | ICD-10-CM

## 2018-02-15 MED ORDER — ACYCLOVIR 400 MG PO TABS: 400.0000 mg | ORAL_TABLET | Freq: Two times a day (BID) | ORAL | 0 refills | Status: DC

## 2018-02-15 NOTE — Telephone Encounter (Unsigned)
Copied from CRM [id number]. Topic: General - Other >> Feb 12, 2018  5:27 PM Marylen Ponto wrote: Reason for CRM: Pt has an appt scheduled for 02/26/18 at 3 pm with [place]. Pt request refill of acyclovir (ZOVIRAX) 400 MG tablet until her appt.

## 2018-02-15 NOTE — Telephone Encounter (Signed)
10 day supply sent to pharmacy. Pt has appointment on 02/26/2018

## 2018-02-26 ENCOUNTER — Ambulatory Visit (INDEPENDENT_AMBULATORY_CARE_PROVIDER_SITE_OTHER): Payer: BLUE CROSS/BLUE SHIELD | Admitting: Physician Assistant

## 2018-02-26 ENCOUNTER — Other Ambulatory Visit: Payer: Self-pay

## 2018-02-26 ENCOUNTER — Encounter: Payer: Self-pay | Admitting: Physician Assistant

## 2018-02-26 VITALS — BP 138/91 | HR 86 | Temp 98.4°F | Resp 16 | Ht 65.0 in | Wt 271.6 lb

## 2018-02-26 DIAGNOSIS — B009 Herpesviral infection, unspecified: Secondary | ICD-10-CM

## 2018-02-26 DIAGNOSIS — Z716 Tobacco abuse counseling: Secondary | ICD-10-CM

## 2018-02-26 DIAGNOSIS — J9809 Other diseases of bronchus, not elsewhere classified: Secondary | ICD-10-CM | POA: Diagnosis not present

## 2018-02-26 DIAGNOSIS — Z720 Tobacco use: Secondary | ICD-10-CM | POA: Diagnosis not present

## 2018-02-26 MED ORDER — VARENICLINE TARTRATE 0.5 MG X 11 & 1 MG X 42 PO MISC: ORAL | 0 refills | Status: AC

## 2018-02-26 MED ORDER — ACYCLOVIR 400 MG PO TABS: 400.0000 mg | ORAL_TABLET | Freq: Two times a day (BID) | ORAL | 3 refills | Status: AC

## 2018-02-26 MED ORDER — ALBUTEROL SULFATE HFA 108 (90 BASE) MCG/ACT IN AERS: 2.0000 | INHALATION_SPRAY | RESPIRATORY_TRACT | 1 refills | Status: AC | PRN

## 2018-02-26 NOTE — Patient Instructions (Addendum)
CHANTIX Instructions: Select a QUIT date at least 7 days in the future. You should be taking the Chantix for at least 7 days before you stop smoking. Let me know if/when you need refills.     If you have lab work done today you will be contacted with your lab results within the next 2 weeks.  If you have not heard from [place] then please contact us. The fastest way to get your results is to register for My Chart.    Smoking Tobacco Information Smoking tobacco will very likely harm your health. Tobacco contains a poisonous (toxic), colorless chemical called nicotine. Nicotine affects the brain and makes tobacco addictive. This change in your brain can make it hard to stop smoking. Tobacco also has other toxic chemicals that can hurt your body and raise your risk of many cancers. How can smoking tobacco affect me? Smoking tobacco can increase your chances of having serious health conditions, such as:  Cancer. Smoking is most commonly associated with lung cancer, but can lead to cancer in other parts of the body.  Chronic obstructive pulmonary disease (COPD). This is a long-term lung condition that makes it hard to breathe. It also gets worse over time.  High blood pressure (hypertension), heart disease, stroke, or heart attack.  Lung infections, such as pneumonia.  Cataracts. This is when the lenses in the eyes become clouded.  Digestive problems. This may include peptic ulcers, heartburn, and gastroesophageal reflux disease (GERD).  Oral health problems, such as gum disease and tooth loss.  Loss of taste and smell.  Smoking can affect your appearance by causing:  Wrinkles.  Yellow or stained teeth, fingers, and fingernails.  Smoking tobacco can also affect your social life.  Many workplaces, [employer], hotels, and public places are tobacco-free. This means that you may experience challenges in finding places to smoke when away from home.  The cost of a smoking habit can be  expensive. Expenses for someone who smokes come in two ways: ? You spend money on a regular basis to buy tobacco. ? Your health care costs in the long-term are higher if you smoke.  Tobacco smoke can also affect the health of those around you. Children of smokers have greater chances of: ? Sudden infant death syndrome (SIDS). ? Ear infections. ? Lung infections.  What lifestyle changes can be made?  Do not start smoking. Quit if you already do.  To quit smoking: ? Make a plan to quit smoking and commit yourself to it. Look for programs to help you and ask your health care provider for recommendations and ideas. ? Talk with your health care provider about using nicotine replacement medicines to help you quit. Medicine replacement medicines include gum, lozenges, patches, sprays, or pills. ? Do not replace cigarette smoking with electronic cigarettes, which are commonly called e-cigarettes. The safety of e-cigarettes is not known, and some may contain harmful chemicals. ? Avoid places, people, or situations that tempt you to smoke. ? If you try to quit but return to smoking, don't give up hope. It is very common for people to try a number of times before they fully succeed. When you feel ready again, give it another try.  Quitting smoking might affect the way you eat as well as your weight. Be prepared to monitor your eating habits. Get support in planning and following a healthy diet.  Ask your health care provider about having regular tests (screenings) to check for cancer. This may include blood tests,  imaging tests, and other tests.  Exercise regularly. Consider taking walks, joining a gym, or doing yoga or exercise classes.  Develop skills to manage your stress. These skills include meditation. What are the benefits of quitting smoking? By quitting smoking, you may:  Lower your risk of getting cancer and other diseases caused by smoking.  Live longer.  Breathe better.  Lower  your blood pressure and heart rate.  Stop your addiction to tobacco.  Stop creating secondhand smoke that hurts other people.  Improve your sense of taste and smell.  Look better over time, due to having fewer wrinkles and less staining.  What can happen if changes are not made? If you do not stop smoking, you may:  Get cancer and other diseases.  Develop COPD or other long-term (chronic) lung conditions.  Develop serious problems with your heart and blood vessels (cardiovascular system).  Need more tests to screen for problems caused by smoking.  Have higher, long-term healthcare costs from medicines or treatments related to smoking.  Continue to have worsening changes in your lungs, mouth, and nose.  Where to find support: To get support to quit smoking, consider:  Asking your health care provider for more information and resources.  Taking classes to learn more about quitting smoking.  Looking for local organizations that offer resources about quitting smoking.  Joining a support group for people who want to quit smoking in your local community.  Where to find more information: You may find more information about quitting smoking from:  HelpGuide.org: www.helpguide.org/articles/addictions/how-to-quit-smoking.htm  [employer]: smokefree.gov  American Lung Association: www.lung.org  Contact a health care provider if:  You have problems breathing.  Your lips, nose, or fingers turn blue.  You have chest pain.  You are coughing up blood.  You feel faint or you pass out.  You have other noticeable changes that cause you to worry. Summary  Smoking tobacco can negatively affect your health, the health of those around you, your finances, and your social life.  Do not start smoking. Quit if you already do. If you need help quitting, ask your health care provider.  Think about joining a support group for people who want to quit smoking in your local  community. There are many effective programs that will help you to quit this behavior. This information is not intended to replace advice given to you by your health care provider. Make sure you discuss any questions you have with your health care provider. Document Released: 04/29/2016 Document Revised: 04/29/2016 Document Reviewed: 04/29/2016 Elsevier Interactive Patient Education  2018 [employer].   IF you received an x-ray today, you will receive an invoice from [facility]. Please contact [facility] at [phone] with questions or concerns regarding your invoice.   IF you received labwork today, you will receive an invoice from [place]. Please contact [facility] at [phone] with questions or concerns regarding your invoice.   Our billing staff will not be able to assist you with questions regarding bills from these companies.  You will be contacted with the lab results as soon as they are available. The fastest way to get your results is to activate your My Chart account. Instructions are located on the last page of this paperwork. If you have not heard from [place] regarding the results in 2 weeks, please contact this office.

## 2018-02-26 NOTE — Progress Notes (Signed)
Is   Valerie Shannon  MRN: [id number] DOB: 06-17-1975  Subjective:  Valerie Shannon is a 42 y.o. female seen in office today for a chief complaint of increased medication refill for acyclovir and albuterol inhaler.  Takes acyclovir 400 mg twice daily for genital HSV suppression.  Has been on this dose for at least 5 years.  Last outbreak was about 3 years ago after forgetting to take tablet.  Tolerates medication well.  Denies headache, nausea, vomiting, dizziness, agitation.  Uses albuterol inhaler only when the season changes for wheezing and shortness of breath.  Notes that she does not have that she will get bronchitis.  She is asymptomatic today.  Denies chest pain, chest tightness, wheezing, shortness of breath, fever, chills.Current every day smoker. Smokes 1ppd x 27 years.  Has tried quitting in the past.  Used Chantix for 1 month and actually quit smoking but when she ran out of tablets started back.  Has tried Wellbutrin with no relief.  Drinks maybe 3 x a month. PMH of HTN. No PMH of seizure disorder, psych disorder, stroke, CHF, or MI.   Review of Systems Per HPI Patient Active Problem List   Diagnosis Date Noted  . HSV (herpes simplex virus) infection 09/25/2016  . Depression 09/25/2016  . Essential hypertension, benign 09/25/2016  . Chest pain 12/11/2010  . Tobacco abuse 12/11/2010  . PREMATURE VENTRICULAR CONTRACTIONS, FREQUENT 05/26/2010  . OVERWEIGHT/OBESITY 05/23/2010  . PALPITATIONS 05/23/2010    Current Outpatient Medications on File Prior to Visit  Medication Sig Dispense Refill  . levonorgestrel (MIRENA) 20 MCG/24HR IUD 1 each by Intrauterine route once.    Marland Kitchen lisinopril (PRINIVIL,ZESTRIL) 10 MG tablet TAKE 1 TABLET BY MOUTH ONCE DAILY 90 tablet 2   No current facility-administered medications on file prior to visit.     Allergies  Allergen Reactions  . Latex     Rash if wears for a long period of time     Objective:  BP (!) 138/91 (BP Location:  Right Arm, Patient Position: Sitting, Cuff Size: Large)   Pulse 86   Temp 98.4 F (36.9 C) (Oral)   Resp 16   Ht 5' 5"  (1.651 m)   Wt 271 lb 9.6 oz (123.2 kg)   SpO2 97%   BMI 45.20 kg/m   Physical Exam  Constitutional: She is oriented to person, place, and time. She appears well-developed and well-nourished. No distress.  HENT:  Head: Normocephalic and atraumatic.  Mouth/Throat: Uvula is midline, oropharynx is clear and moist and mucous membranes are normal.  Eyes: Pupils are equal, round, and reactive to light. Conjunctivae and EOM are normal.  Neck: Normal range of motion.  Cardiovascular: Normal rate, regular rhythm, normal heart sounds and intact distal pulses.  Pulmonary/Chest: Effort normal and breath sounds normal. She has no wheezes. She has no rhonchi. She has no rales.  Musculoskeletal:       Right lower leg: She exhibits no swelling.       Left lower leg: She exhibits no swelling.  Neurological: She is alert and oriented to person, place, and time.  Skin: Skin is warm and dry.  Psychiatric: She has a normal mood and affect.  Vitals reviewed.   Assessment and Plan :  1. HSV (herpes simplex virus) infection Asymptomatic.  Labs pending.  Refills provided.  Discussed chronic use of acyclovir.  Patient would like to continue with the daily suppression and may consider trying a trial off in the future but not at this  time. - CMP14+EGFR - acyclovir (ZOVIRAX) 400 MG tablet; Take 1 tablet (400 mg total) by mouth 2 (two) times daily.  Dispense: 180 tablet; Refill: 3  2. Recurrent bronchospasm Refills provided.  Asymptomatic at this time. - albuterol (PROVENTIL HFA;VENTOLIN HFA) 108 (90 Base) MCG/ACT inhaler; Inhale 2 puffs into the lungs every 4 (four) hours as needed for wheezing or shortness of breath (cough, shortness of breath or wheezing.).  Dispense: 1 Inhaler; Refill: 1  3. Tobacco abuse Discussed smoking cessation with patient.  She is ready to quit.  Given Rx for  Chantix.  Follow-up in 4 to 6 weeks. - varenicline (CHANTIX PAK) 0.5 MG X 11 & 1 MG X 42 tablet; Take one 0.5 mg tablet by mouth once daily for 3 days, then increase to one 0.5 mg tablet twice daily for 4 days, then increase to one 1 mg tablet twice daily.  Dispense: 53 tablet; Refill: 0  4. Encounter for smoking cessation counseling - varenicline (CHANTIX PAK) 0.5 MG X 11 & 1 MG X 42 tablet; Take one 0.5 mg tablet by mouth once daily for 3 days, then increase to one 0.5 mg tablet twice daily for 4 days, then increase to one 1 mg tablet twice daily.  Dispense: 53 tablet; Refill: 0  Side effects, risks, benefits, and alternatives of the medications and treatment plan prescribed today were discussed, and patient expressed understanding of the instructions given. No barriers to understanding were identified. Red flags discussed in detail. Pt expressed understanding regarding what to do in case of emergency/urgent symptoms.  Note - This record has been created using [employer].  Chart creation errors have been sought, but may not always  have been located. Such creation errors do not reflect on  the standard of medical care.   Tenna Delaine PA-C  [facility] 02/26/2018 6:12 PM

## 2018-02-27 LAB — CMP14+EGFR
ALT: 73 IU/L — AB (ref 0–32)
AST: 64 IU/L — ABNORMAL HIGH (ref 0–40)
Albumin/Globulin Ratio: 1.7 (ref 1.2–2.2)
Albumin: 4.5 g/dL (ref 3.5–5.5)
Alkaline Phosphatase: 72 IU/L (ref 39–117)
BUN/Creatinine Ratio: 22 (ref 9–23)
BUN: 18 mg/dL (ref 6–24)
Bilirubin Total: 0.3 mg/dL (ref 0.0–1.2)
CO2: 21 mmol/L (ref 20–29)
Calcium: 10.2 mg/dL (ref 8.7–10.2)
Chloride: 103 mmol/L (ref 96–106)
Creatinine, Ser: 0.83 mg/dL (ref 0.57–1.00)
GFR calc non Af Amer: 86 mL/min/{1.73_m2} (ref >59)
GFR, EST AFRICAN AMERICAN: 99 mL/min/{1.73_m2} (ref >59)
GLUCOSE: 100 mg/dL — AB (ref 65–99)
Globulin, Total: 2.6 g/dL (ref 1.5–4.5)
Potassium: 4.5 mmol/L (ref 3.5–5.2)
Sodium: 142 mmol/L (ref 134–144)
TOTAL PROTEIN: 7.1 g/dL (ref 6.0–8.5)

## 2018-11-22 DIAGNOSIS — Z20828 Contact with and (suspected) exposure to other viral communicable diseases: Secondary | ICD-10-CM | POA: Diagnosis not present

## 2019-03-18 DIAGNOSIS — Z1231 Encounter for screening mammogram for malignant neoplasm of breast: Secondary | ICD-10-CM | POA: Diagnosis not present

## 2019-03-18 DIAGNOSIS — Z124 Encounter for screening for malignant neoplasm of cervix: Secondary | ICD-10-CM | POA: Diagnosis not present

## 2019-03-18 DIAGNOSIS — N924 Excessive bleeding in the premenopausal period: Secondary | ICD-10-CM | POA: Diagnosis not present

## 2019-03-18 DIAGNOSIS — Z01419 Encounter for gynecological examination (general) (routine) without abnormal findings: Secondary | ICD-10-CM | POA: Diagnosis not present

## 2019-04-04 DIAGNOSIS — Z30433 Encounter for removal and reinsertion of intrauterine contraceptive device: Secondary | ICD-10-CM | POA: Diagnosis not present

## 2019-04-08 DIAGNOSIS — Z124 Encounter for screening for malignant neoplasm of cervix: Secondary | ICD-10-CM | POA: Diagnosis not present

## 2019-04-08 DIAGNOSIS — Z01419 Encounter for gynecological examination (general) (routine) without abnormal findings: Secondary | ICD-10-CM | POA: Diagnosis not present

## 2019-04-08 DIAGNOSIS — Z1231 Encounter for screening mammogram for malignant neoplasm of breast: Secondary | ICD-10-CM | POA: Diagnosis not present

## 2019-04-08 DIAGNOSIS — Z30432 Encounter for removal of intrauterine contraceptive device: Secondary | ICD-10-CM | POA: Diagnosis not present

## 2019-04-08 DIAGNOSIS — N924 Excessive bleeding in the premenopausal period: Secondary | ICD-10-CM | POA: Diagnosis not present

## 2019-06-30 DIAGNOSIS — Z20828 Contact with and (suspected) exposure to other viral communicable diseases: Secondary | ICD-10-CM | POA: Diagnosis not present

## 2019-06-30 DIAGNOSIS — U071 COVID-19: Secondary | ICD-10-CM | POA: Diagnosis not present

## 2022-05-16 LAB — COLOGUARD: COLOGUARD: POSITIVE — AB

## 2022-05-21 ENCOUNTER — Encounter: Payer: Self-pay | Admitting: Gastroenterology

## 2022-05-28 ENCOUNTER — Other Ambulatory Visit: Payer: Self-pay

## 2022-05-28 ENCOUNTER — Ambulatory Visit (AMBULATORY_SURGERY_CENTER): Payer: Self-pay

## 2022-05-28 VITALS — Ht 65.0 in | Wt 235.0 lb

## 2022-05-28 DIAGNOSIS — R195 Other fecal abnormalities: Secondary | ICD-10-CM

## 2022-05-28 MED ORDER — NA SULFATE-K SULFATE-MG SULF 17.5-3.13-1.6 GM/177ML PO SOLN: 1.0000 | Freq: Once | ORAL | 0 refills | Status: AC

## 2022-05-28 NOTE — Progress Notes (Signed)
Denies allergies to eggs or soy products. Denies complication of anesthesia or sedation. Denies use of weight loss medication. Denies use of O2.   Emmi instructions given for colonoscopy. 

## 2022-06-06 ENCOUNTER — Encounter: Payer: Self-pay | Admitting: Gastroenterology

## 2022-06-17 ENCOUNTER — Encounter: Payer: Self-pay | Admitting: Certified Registered Nurse Anesthetist

## 2022-06-18 ENCOUNTER — Encounter: Payer: Self-pay | Admitting: Gastroenterology

## 2022-06-18 ENCOUNTER — Ambulatory Visit: Payer: BC Managed Care – PPO | Admitting: Gastroenterology

## 2022-06-18 VITALS — BP 137/70 | HR 74 | Temp 97.7°F | Resp 14 | Ht 65.0 in | Wt 235.0 lb

## 2022-06-18 DIAGNOSIS — R195 Other fecal abnormalities: Secondary | ICD-10-CM | POA: Diagnosis present

## 2022-06-18 MED ORDER — SODIUM CHLORIDE 0.9 % IV SOLN: 500.0000 mL | Freq: Once | INTRAVENOUS | Status: DC

## 2022-06-18 NOTE — Progress Notes (Signed)
[facility] History and Physical   Primary Care Physician:  Horald Pollen, MD   Reason for Procedure:  Positive FIT  Plan:    Colonoscopy with possible interventions as needed     HPI: Valerie Shannon is a very pleasant 47 y.o. female here for  colonoscopy for evaluation of positive FIT .   The risks and benefits as well as alternatives of endoscopic procedure(s) have been discussed and reviewed. All questions answered. The patient agrees to proceed.    Past Medical History:  Diagnosis Date   Allergy    Anxiety    Depression    GERD (gastroesophageal reflux disease)    Hypertension    Obesity    Premature ventricular contractions    Sleep apnea    Substance abuse (HCC)    cocaine, in remission, > 10 years ago   Tobacco dependence     Past Surgical History:  Procedure Laterality Date   bilateral tubal ligation  04/29/1995   TUBAL LIGATION     WISDOM TOOTH EXTRACTION Bilateral     Prior to Admission medications   Medication Sig Start Date End Date Taking? Authorizing Provider  acyclovir (ZOVIRAX) 400 MG tablet Take 1 tablet (400 mg total) by mouth 2 (two) times daily. 02/26/18   Tenna Delaine D, PA-C  albuterol (PROVENTIL HFA;VENTOLIN HFA) 108 (90 Base) MCG/ACT inhaler Inhale 2 puffs into the lungs every 4 (four) hours as needed for wheezing or shortness of breath (cough, shortness of breath or wheezing.). 02/26/18   Tenna Delaine D, PA-C  levonorgestrel (MIRENA) 20 MCG/24HR IUD 1 each by Intrauterine route once.    [provider]  lisinopril (PRINIVIL,ZESTRIL) 10 MG tablet TAKE 1 TABLET BY MOUTH ONCE DAILY 01/15/18   Horald Pollen, MD  varenicline (CHANTIX PAK) 0.5 MG X 11 & 1 MG X 42 tablet Take one 0.5 mg tablet by mouth once daily for 3 days, then increase to one 0.5 mg tablet twice daily for 4 days, then increase to one 1 mg tablet twice daily. 02/26/18   Leonie Douglas, PA-C    Current Outpatient Medications   Medication Sig Dispense Refill   acyclovir (ZOVIRAX) 400 MG tablet Take 1 tablet (400 mg total) by mouth 2 (two) times daily. 180 tablet 3   albuterol (PROVENTIL HFA;VENTOLIN HFA) 108 (90 Base) MCG/ACT inhaler Inhale 2 puffs into the lungs every 4 (four) hours as needed for wheezing or shortness of breath (cough, shortness of breath or wheezing.). 1 Inhaler 1   levonorgestrel (MIRENA) 20 MCG/24HR IUD 1 each by Intrauterine route once.     lisinopril (PRINIVIL,ZESTRIL) 10 MG tablet TAKE 1 TABLET BY MOUTH ONCE DAILY 90 tablet 2   varenicline (CHANTIX PAK) 0.5 MG X 11 & 1 MG X 42 tablet Take one 0.5 mg tablet by mouth once daily for 3 days, then increase to one 0.5 mg tablet twice daily for 4 days, then increase to one 1 mg tablet twice daily. 53 tablet 0   Current Facility-Administered Medications  Medication Dose Route Frequency Provider Last Rate Last Admin   0.9 %  sodium chloride infusion  500 mL Intravenous Once Mauri Pole, MD        Allergies as of 06/18/2022 - Review Complete 06/18/2022  Allergen Reaction Noted   Latex  03/07/2013    Family History  Problem Relation Age of Onset   Depression Mother    Heart disease Father    Heart failure Father    Depression  Maternal Grandmother    Cancer Maternal Grandmother    Depression Maternal Grandfather    Cancer Paternal Grandmother    Heart disease Paternal Grandmother    Heart disease Paternal Grandfather    ADD / ADHD Son    Arrhythmia Other        father had a sudden cardiac arrest at age 52 and was rescucitated 3-4 years ago.  He has an ICD and is followed by Dr Lovena Le.  Paternal grandfather died suddenly at age 34 at home.   Colon cancer Neg Hx    Esophageal cancer Neg Hx    Stomach cancer Neg Hx    Rectal cancer Neg Hx     Social History   Socioeconomic History   Marital status: Single    Spouse name: Not on file   Number of children: Not on file   Years of education: Not on file   Highest education level:  Not on file  Occupational History   Not on file  Tobacco Use   Smoking status: Former    Packs/day: 1.00    Years: 27.00    Total pack years: 27.00    Types: Cigarettes, E-cigarettes   Smokeless tobacco: Current   Tobacco comments:    chantix prescribed today, she is willing to quit. Pouch with Nicotene.  Substance and Sexual Activity   Alcohol use: Yes    Comment: every weekend   Drug use: No   Sexual activity: Not on file  Other Topics Concern   Not on file  Social History Narrative   Works full time for [employer].  She is divorced and has 2 children.   Social Determinants of Health   Financial Resource Strain: Not on file  Food Insecurity: Not on file  Transportation Needs: Not on file  Physical Activity: Not on file  Stress: Not on file  Social Connections: Not on file  Intimate Partner Violence: Not on file    Review of Systems:  All other review of systems negative except as mentioned in the HPI.  Physical Exam: Vital signs in last 24 hours: Blood Pressure (Abnormal) 165/110   Pulse 70   Temperature 97.7 F (36.5 C)   Height 5' 5"$  (1.651 m)   Weight 235 lb (106.6 kg)   Oxygen Saturation 100%   Body Mass Index 39.11 kg/m  General:   Alert, NAD Lungs:  Clear .   Heart:  Regular rate and rhythm Abdomen:  Soft, nontender and nondistended. Neuro/Psych:  Alert and cooperative. Normal mood and affect. A and O x 3  Reviewed labs, radiology imaging, old records and pertinent past GI work up  Patient is appropriate for planned procedure(s) and anesthesia in an ambulatory setting   K. Denzil Magnuson , MD [phone]

## 2022-06-18 NOTE — Patient Instructions (Signed)
Thank you for coming in to see [place] today Resume previous diet and medications/supplements today. Return to regular daily activities tomorrow. Recommend next screening colonoscopy in 10 years.  YOU HAD AN ENDOSCOPIC PROCEDURE TODAY AT [facility]:   Refer to the procedure report that was given to you for any specific questions about what was found during the examination.  If the procedure report does not answer your questions, please call your gastroenterologist to clarify.  If you requested that your care partner not be given the details of your procedure findings, then the procedure report has been included in a sealed envelope for you to review at your convenience later.  YOU SHOULD EXPECT: Some feelings of bloating in the abdomen. Passage of more gas than usual.  Walking can help get rid of the air that was put into your GI tract during the procedure and reduce the bloating. If you had a lower endoscopy (such as a colonoscopy or flexible sigmoidoscopy) you may notice spotting of blood in your stool or on the toilet paper. If you underwent a bowel prep for your procedure, you may not have a normal bowel movement for a few days.  Please Note:  You might notice some irritation and congestion in your nose or some drainage.  This is from the oxygen used during your procedure.  There is no need for concern and it should clear up in a day or so.  SYMPTOMS TO REPORT IMMEDIATELY:  Following lower endoscopy (colonoscopy or flexible sigmoidoscopy):  Excessive amounts of blood in the stool  Significant tenderness or worsening of abdominal pains  Swelling of the abdomen that is new, acute  Fever of 100F or higher  Following upper endoscopy (EGD)  Vomiting of blood or coffee ground material  New chest pain or pain under the shoulder blades  Painful or persistently difficult swallowing  New shortness of breath  Fever of 100F or higher  Black, tarry-looking stools  For urgent or  emergent issues, a gastroenterologist can be reached at any hour by calling [phone]. Do not use MyChart messaging for urgent concerns.    DIET:  We do recommend a small meal at first, but then you may proceed to your regular diet.  Drink plenty of fluids but you should avoid alcoholic beverages for 24 hours.  ACTIVITY:  You should plan to take it easy for the rest of today and you should NOT DRIVE or use heavy machinery until tomorrow (because of the sedation medicines used during the test).    FOLLOW UP: Our staff will call the number listed on your records the next business day following your procedure.  We will call around 7:15- 8:00 am to check on you and address any questions or concerns that you may have regarding the information given to you following your procedure. If we do not reach you, we will leave a message.     If any biopsies were taken you will be contacted by phone or by letter within the next 1-3 weeks.  Please call us at [phone] if you have not heard about the biopsies in 3 weeks.    SIGNATURES/CONFIDENTIALITY: You and/or your care partner have signed paperwork which will be entered into your electronic medical record.  These signatures attest to the fact that that the information above on your After Visit Summary has been reviewed and is understood.  Full responsibility of the confidentiality of this discharge information lies with you and/or your care-partner.

## 2022-06-18 NOTE — Progress Notes (Signed)
Report given to PACU, vss 

## 2022-06-18 NOTE — Op Note (Signed)
[facility] Patient Name: Valerie Shannon Procedure Date: 06/18/2022 10:44 AM MRN: [id number] Endoscopist: Mauri Pole , MD, [id number] Age: 47 Referring MD:  Date of Birth: 08/01/1975 Gender: Female Account #: [id number] Procedure:                Colonoscopy Indications:              Positive fecal immunochemical test Medicines:                Monitored Anesthesia Care Procedure:                Pre-Anesthesia Assessment:                           - Prior to the procedure, a History and Physical                            was performed, and patient medications and                            allergies were reviewed. The patient's tolerance of                            previous anesthesia was also reviewed. The risks                            and benefits of the procedure and the sedation                            options and risks were discussed with the patient.                            All questions were answered, and informed consent                            was obtained. Prior Anticoagulants: The patient has                            taken no anticoagulant or antiplatelet agents. ASA                            Grade Assessment: II - A patient with mild systemic                            disease. After reviewing the risks and benefits,                            the patient was deemed in satisfactory condition to                            undergo the procedure.                           After obtaining informed consent, the colonoscope  was passed under direct vision. Throughout the                            procedure, the patient's blood pressure, pulse, and                            oxygen saturations were monitored continuously. The                            [id number]-HQ190L Colonoscope [id number] was introduced                            through the anus and advanced to the the cecum,                            identified by  appendiceal orifice and ileocecal                            valve. The colonoscopy was performed without                            difficulty. The patient tolerated the procedure                            well. The quality of the bowel preparation was                            good. The ileocecal valve, appendiceal orifice, and                            rectum were photographed. Scope In: 10:50:23 AM Scope Out: 11:02:54 AM Scope Withdrawal Time: 0 hours 8 minutes 11 seconds  Total Procedure Duration: 0 hours 12 minutes 31 seconds  Findings:                 The perianal and digital rectal examinations were                            normal.                           Scattered small-mouthed diverticula were found in                            the sigmoid colon, descending colon, transverse                            colon and ascending colon.                           Non-bleeding external and internal hemorrhoids were                            found during retroflexion. The hemorrhoids were  medium-sized. Complications:            No immediate complications. Estimated Blood Loss:     Estimated blood loss was minimal. Impression:               - Moderate diverticulosis in the sigmoid colon, in                            the descending colon, in the transverse colon and                            in the ascending colon.                           - Non-bleeding external and internal hemorrhoids.                           - No specimens collected. Recommendation:           - Patient has a contact number available for                            emergencies. The signs and symptoms of potential                            delayed complications were discussed with the                            patient. Return to normal activities tomorrow.                            Written discharge instructions were provided to the                            patient.                            - Resume previous diet.                           - Continue present medications.                           - Repeat colonoscopy in 10 years for surveillance. Mauri Pole, MD 06/18/2022 11:12:13 AM This report has been signed electronically.

## 2022-06-18 NOTE — Progress Notes (Signed)
Pt's states no medical or surgical changes since previsit or office visit. 

## 2022-06-19 ENCOUNTER — Telehealth: Payer: Self-pay

## 2022-06-19 NOTE — Telephone Encounter (Signed)
Follow up call placed, VM obtained and message left.
